# Patient Record
Sex: Female | Born: 1970 | Race: Black or African American | Hispanic: No | Smoking: Never smoker
Health system: Southern US, Community
[De-identification: ages and names within clinical notes are randomized; demographics above are authoritative.]

## PROBLEM LIST (undated history)

## (undated) ENCOUNTER — Emergency Department (HOSPITAL_COMMUNITY): Discharge status: Against Medical Advice | Payer: 59

## (undated) DIAGNOSIS — K56609 Unspecified intestinal obstruction, unspecified as to partial versus complete obstruction: Secondary | ICD-10-CM

## (undated) DIAGNOSIS — I10 Essential (primary) hypertension: Secondary | ICD-10-CM

## (undated) HISTORY — PX: ABDOMINAL EXPLORATION SURGERY: SHX538

---

## 2007-02-19 ENCOUNTER — Other Ambulatory Visit: Admission: RE | Admit: 2007-02-19 | Discharge: 2007-02-19 | Payer: Self-pay | Admitting: Obstetrics and Gynecology

## 2007-03-23 ENCOUNTER — Other Ambulatory Visit: Admission: RE | Admit: 2007-03-23 | Discharge: 2007-03-23 | Payer: Self-pay | Admitting: Obstetrics and Gynecology

## 2007-10-21 ENCOUNTER — Emergency Department (HOSPITAL_COMMUNITY): Admission: EM | Admit: 2007-10-21 | Discharge: 2007-10-21 | Payer: Self-pay | Admitting: Emergency Medicine

## 2007-12-04 ENCOUNTER — Inpatient Hospital Stay (HOSPITAL_COMMUNITY): Admission: EM | Admit: 2007-12-04 | Discharge: 2007-12-06 | Payer: Self-pay | Admitting: Emergency Medicine

## 2008-03-12 ENCOUNTER — Encounter: Admission: RE | Admit: 2008-03-12 | Discharge: 2008-04-08 | Payer: Self-pay | Admitting: Sports Medicine

## 2008-05-05 ENCOUNTER — Other Ambulatory Visit: Admission: RE | Admit: 2008-05-05 | Discharge: 2008-05-05 | Payer: Self-pay | Admitting: Obstetrics and Gynecology

## 2008-06-03 ENCOUNTER — Emergency Department (HOSPITAL_COMMUNITY): Admission: EM | Admit: 2008-06-03 | Discharge: 2008-06-03 | Payer: Self-pay | Admitting: Emergency Medicine

## 2008-06-24 ENCOUNTER — Encounter: Admission: RE | Admit: 2008-06-24 | Discharge: 2008-07-31 | Payer: Self-pay | Admitting: Family Medicine

## 2010-10-28 ENCOUNTER — Inpatient Hospital Stay (HOSPITAL_COMMUNITY): Admission: EM | Admit: 2010-10-28 | Discharge: 2009-11-25 | Payer: Self-pay | Admitting: Emergency Medicine

## 2011-02-06 LAB — COMPREHENSIVE METABOLIC PANEL
BUN: 8 mg/dL (ref 6–23)
CO2: 22 mEq/L (ref 19–32)
Calcium: 9.2 mg/dL (ref 8.4–10.5)
Chloride: 108 mEq/L (ref 96–112)
Creatinine, Ser: 0.91 mg/dL (ref 0.4–1.2)
GFR calc Af Amer: >60 mL/min (ref >60)
GFR calc non Af Amer: >60 mL/min (ref >60)
Glucose, Bld: 86 mg/dL (ref 70–99)
Total Bilirubin: 0.7 mg/dL (ref 0.3–1.2)

## 2011-02-06 LAB — CBC
HCT: 36.3 % (ref 36.0–46.0)
HCT: 40.7 % (ref 36.0–46.0)
Hemoglobin: 12.1 g/dL (ref 12.0–15.0)
Hemoglobin: 13.5 g/dL (ref 12.0–15.0)
MCHC: 33.1 g/dL (ref 30.0–36.0)
MCV: 90.8 fL (ref 78.0–100.0)
Platelets: 264 10*3/uL (ref 150–400)
RBC: 4.48 MIL/uL (ref 3.87–5.11)
RDW: 13 % (ref 11.5–15.5)
WBC: 9.2 10*3/uL (ref 4.0–10.5)
WBC: 9.7 10*3/uL (ref 4.0–10.5)

## 2011-02-06 LAB — URINALYSIS, ROUTINE W REFLEX MICROSCOPIC
Bilirubin Urine: NEGATIVE
Glucose, UA: NEGATIVE mg/dL
Hgb urine dipstick: NEGATIVE
Specific Gravity, Urine: 1.006 (ref 1.005–1.030)
Urobilinogen, UA: 0.2 mg/dL (ref 0.0–1.0)

## 2011-02-06 LAB — LIPASE, BLOOD: Lipase: 16 U/L (ref 11–59)

## 2011-02-06 LAB — DIFFERENTIAL
Basophils Absolute: 0 10*3/uL (ref 0.0–0.1)
Basophils Absolute: 0 10*3/uL (ref 0.0–0.1)
Eosinophils Relative: 0 % (ref 0–5)
Lymphocytes Relative: 13 % (ref 12–46)
Lymphocytes Relative: 16 % (ref 12–46)
Lymphs Abs: 1.3 10*3/uL (ref 0.7–4.0)
Lymphs Abs: 1.5 10*3/uL (ref 0.7–4.0)
Monocytes Absolute: 0.5 10*3/uL (ref 0.1–1.0)
Neutro Abs: 7.2 10*3/uL (ref 1.7–7.7)
Neutro Abs: 7.8 10*3/uL — ABNORMAL HIGH (ref 1.7–7.7)
Neutrophils Relative %: 81 % — ABNORMAL HIGH (ref 43–77)

## 2011-02-06 LAB — BASIC METABOLIC PANEL
Calcium: 8.4 mg/dL (ref 8.4–10.5)
GFR calc non Af Amer: >60 mL/min (ref >60)
Glucose, Bld: 102 mg/dL — ABNORMAL HIGH (ref 70–99)
Potassium: 3.7 mEq/L (ref 3.5–5.1)
Sodium: 139 mEq/L (ref 135–145)

## 2011-02-06 LAB — POCT PREGNANCY, URINE: Preg Test, Ur: NEGATIVE
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
## 2011-04-05 NOTE — Discharge Summary (Signed)
Marilyn Burgess, Marilyn Burgess              ACCOUNT NO.:  [id number]   MEDICAL RECORD NO.:  [id number]          PATIENT TYPE:  INP   LOCATION:  1532                         FACILITY:  [facility]   PHYSICIAN:  Lennie Muckle, MD      DATE OF BIRTH:  Dec 01, 1970   DATE OF ADMISSION:  12/04/2007  DATE OF DISCHARGE:  12/06/2007                               DISCHARGE SUMMARY   FINAL DIAGNOSIS:  Partial small bowel obstruction.   HOSPITAL COURSE:  [name] was admitted from the emergency department  on 12/03/06 due to abdominal pain, nausea and vomiting.  CT scan  revealed a partial bowel obstruction.  She was treated with NG tube for  decompression and began to have flatus as well as decrease in abdominal  pain.  On the 15th, her pain was resolved.  She was passing flatus  without nausea.  NG tube was able to be discontinued.  She was started  on a liquid diet, advanced to a regular diet without difficulty and had  no further episodes of pain.  She is discharged with instructions to  follow up with her primary care physician.  She will follow up with me  on p.r.n. basis as there is no need for surgical intervention.      Lennie Muckle, MD  Electronically Signed     ALA/MEDQ  D:  12/06/2007  T:  12/06/2007  Job:  [id number]   cc:   [address] Dr. Celene Skeen at [facility]

## 2011-05-17 ENCOUNTER — Emergency Department (HOSPITAL_COMMUNITY): Payer: 59

## 2011-05-17 ENCOUNTER — Emergency Department (HOSPITAL_COMMUNITY)
Admission: EM | Admit: 2011-05-17 | Discharge: 2011-05-17 | Disposition: A | Discharge status: Home / Self Care | Payer: 59 | Attending: Emergency Medicine | Admitting: Emergency Medicine

## 2011-05-17 DIAGNOSIS — I10 Essential (primary) hypertension: Secondary | ICD-10-CM | POA: Insufficient documentation

## 2011-05-17 DIAGNOSIS — R109 Unspecified abdominal pain: Secondary | ICD-10-CM | POA: Insufficient documentation

## 2011-05-17 DIAGNOSIS — E876 Hypokalemia: Secondary | ICD-10-CM | POA: Insufficient documentation

## 2011-05-17 LAB — URINE MICROSCOPIC-ADD ON

## 2011-05-17 LAB — URINALYSIS, ROUTINE W REFLEX MICROSCOPIC
Bilirubin Urine: NEGATIVE
Ketones, ur: NEGATIVE mg/dL
Nitrite: NEGATIVE
Protein, ur: NEGATIVE mg/dL
pH: 7 (ref 5.0–8.0)

## 2011-05-17 LAB — COMPREHENSIVE METABOLIC PANEL
AST: 18 U/L (ref 0–37)
BUN: 13 mg/dL (ref 6–23)
CO2: 25 mEq/L (ref 19–32)
Chloride: 100 mEq/L (ref 96–112)
Creatinine, Ser: 0.83 mg/dL (ref 0.50–1.10)
GFR calc Af Amer: >60 mL/min (ref >60)
GFR calc non Af Amer: >60 mL/min (ref >60)
Glucose, Bld: 110 mg/dL — ABNORMAL HIGH (ref 70–99)
Total Bilirubin: 0.4 mg/dL (ref 0.3–1.2)

## 2011-05-17 LAB — DIFFERENTIAL
Lymphocytes Relative: 26 % (ref 12–46)
Lymphs Abs: 2.1 10*3/uL (ref 0.7–4.0)
Monocytes Relative: 6 % (ref 3–12)
Neutrophils Relative %: 67 % (ref 43–77)

## 2011-05-17 LAB — LIPASE, BLOOD: Lipase: 19 U/L (ref 11–59)

## 2011-05-17 LAB — CBC
HCT: 38.8 % (ref 36.0–46.0)
MCH: 29.6 pg (ref 26.0–34.0)
MCV: 85.7 fL (ref 78.0–100.0)
RBC: 4.53 MIL/uL (ref 3.87–5.11)
WBC: 8.4 10*3/uL (ref 4.0–10.5)

## 2011-05-17 LAB — PREGNANCY, URINE: Preg Test, Ur: NEGATIVE

## 2011-08-11 LAB — URINALYSIS, ROUTINE W REFLEX MICROSCOPIC
Glucose, UA: NEGATIVE
Hgb urine dipstick: NEGATIVE
Protein, ur: 30 — AB
Specific Gravity, Urine: 1.025
pH: 8

## 2011-08-11 LAB — DIFFERENTIAL
Basophils Absolute: 0
Basophils Relative: 0
Lymphocytes Relative: 8 — ABNORMAL LOW
Monocytes Absolute: 0.2
Neutro Abs: 14.2 — ABNORMAL HIGH

## 2011-08-11 LAB — URINE MICROSCOPIC-ADD ON

## 2011-08-11 LAB — COMPREHENSIVE METABOLIC PANEL
AST: 22
Albumin: 3.7
Alkaline Phosphatase: 70
BUN: 7
Creatinine, Ser: 0.96
GFR calc Af Amer: >60
Potassium: 3.9
Total Protein: 7.3

## 2011-08-11 LAB — CBC
Hemoglobin: 14.2
MCHC: 34.5
Platelets: 349
RDW: 12.6

## 2011-08-18 LAB — URINALYSIS, ROUTINE W REFLEX MICROSCOPIC
Glucose, UA: NEGATIVE
Hgb urine dipstick: NEGATIVE
pH: 7

## 2011-08-18 LAB — POCT I-STAT, CHEM 8
BUN: 12
Chloride: 102
Creatinine, Ser: 1.1
Potassium: 3.6
Sodium: 139

## 2011-08-30 LAB — CBC
HCT: 35.8 — ABNORMAL LOW
Hemoglobin: 12.4
MCHC: 34.7
MCV: 87.3
RBC: 4.1

## 2011-08-30 LAB — BASIC METABOLIC PANEL
CO2: 25
Calcium: 8.5
Chloride: 108
GFR calc Af Amer: >60
Potassium: 3.6
Sodium: 139

## 2011-08-30 LAB — DIFFERENTIAL
Basophils Relative: 1
Eosinophils Relative: 0
Monocytes Absolute: 0.5
Monocytes Relative: 9
Neutro Abs: 3.2

## 2011-08-30 LAB — POCT CARDIAC MARKERS: Myoglobin, poc: 46

## 2011-10-06 DIAGNOSIS — E876 Hypokalemia: Secondary | ICD-10-CM

## 2011-10-06 HISTORY — DX: Hypokalemia: E87.6

## 2011-10-18 ENCOUNTER — Ambulatory Visit: Payer: 59 | Admitting: Physical Therapy

## 2011-10-19 ENCOUNTER — Ambulatory Visit: Payer: 59 | Attending: Family Medicine | Admitting: Physical Therapy

## 2011-10-19 DIAGNOSIS — M25619 Stiffness of unspecified shoulder, not elsewhere classified: Secondary | ICD-10-CM | POA: Insufficient documentation

## 2011-10-19 DIAGNOSIS — M25519 Pain in unspecified shoulder: Secondary | ICD-10-CM | POA: Insufficient documentation

## 2011-10-19 DIAGNOSIS — IMO0001 Reserved for inherently not codable concepts without codable children: Secondary | ICD-10-CM | POA: Insufficient documentation

## 2011-10-25 ENCOUNTER — Ambulatory Visit: Payer: 59 | Attending: Family Medicine | Admitting: Physical Therapy

## 2011-10-25 DIAGNOSIS — IMO0001 Reserved for inherently not codable concepts without codable children: Secondary | ICD-10-CM | POA: Insufficient documentation

## 2011-10-25 DIAGNOSIS — M25519 Pain in unspecified shoulder: Secondary | ICD-10-CM | POA: Insufficient documentation

## 2011-10-25 DIAGNOSIS — M25619 Stiffness of unspecified shoulder, not elsewhere classified: Secondary | ICD-10-CM | POA: Insufficient documentation

## 2011-10-28 ENCOUNTER — Ambulatory Visit: Payer: 59 | Admitting: Rehabilitation

## 2011-11-01 ENCOUNTER — Ambulatory Visit: Payer: 59 | Admitting: Physical Therapy

## 2011-11-03 ENCOUNTER — Ambulatory Visit: Payer: 59 | Admitting: Physical Therapy

## 2011-11-08 DIAGNOSIS — IMO0002 Reserved for concepts with insufficient information to code with codable children: Secondary | ICD-10-CM

## 2011-11-08 DIAGNOSIS — F064 Anxiety disorder due to known physiological condition: Secondary | ICD-10-CM

## 2011-11-08 DIAGNOSIS — I10 Essential (primary) hypertension: Secondary | ICD-10-CM

## 2011-11-08 HISTORY — DX: Essential (primary) hypertension: I10

## 2011-11-08 HISTORY — DX: Anxiety disorder due to known physiological condition: F06.4

## 2011-11-08 HISTORY — DX: Reserved for concepts with insufficient information to code with codable children: IMO0002

## 2011-11-09 ENCOUNTER — Ambulatory Visit: Payer: 59 | Admitting: Physical Therapy

## 2012-01-04 ENCOUNTER — Ambulatory Visit: Payer: 59 | Admitting: Physical Therapy

## 2012-01-17 ENCOUNTER — Ambulatory Visit: Payer: 59 | Attending: Family Medicine | Admitting: Physical Therapy

## 2012-01-17 DIAGNOSIS — M25519 Pain in unspecified shoulder: Secondary | ICD-10-CM | POA: Insufficient documentation

## 2012-01-17 DIAGNOSIS — M25619 Stiffness of unspecified shoulder, not elsewhere classified: Secondary | ICD-10-CM | POA: Insufficient documentation

## 2012-01-17 DIAGNOSIS — IMO0001 Reserved for inherently not codable concepts without codable children: Secondary | ICD-10-CM | POA: Insufficient documentation

## 2012-01-26 ENCOUNTER — Ambulatory Visit: Payer: 59 | Attending: Family Medicine | Admitting: Physical Therapy

## 2012-01-26 DIAGNOSIS — M25619 Stiffness of unspecified shoulder, not elsewhere classified: Secondary | ICD-10-CM | POA: Insufficient documentation

## 2012-01-26 DIAGNOSIS — IMO0001 Reserved for inherently not codable concepts without codable children: Secondary | ICD-10-CM | POA: Insufficient documentation

## 2012-01-26 DIAGNOSIS — M25519 Pain in unspecified shoulder: Secondary | ICD-10-CM | POA: Insufficient documentation

## 2012-01-31 ENCOUNTER — Ambulatory Visit: Payer: 59 | Admitting: Physical Therapy

## 2012-02-02 ENCOUNTER — Ambulatory Visit: Payer: 59 | Admitting: Physical Therapy

## 2012-02-07 ENCOUNTER — Ambulatory Visit: Payer: 59 | Admitting: Physical Therapy

## 2012-02-09 ENCOUNTER — Ambulatory Visit: Payer: 59 | Admitting: Physical Therapy

## 2012-02-14 ENCOUNTER — Ambulatory Visit: Payer: 59 | Admitting: Physical Therapy

## 2012-02-16 ENCOUNTER — Ambulatory Visit: Payer: 59 | Admitting: Physical Therapy

## 2012-02-21 ENCOUNTER — Ambulatory Visit: Payer: 59 | Attending: Family Medicine | Admitting: Physical Therapy

## 2012-02-21 DIAGNOSIS — M25619 Stiffness of unspecified shoulder, not elsewhere classified: Secondary | ICD-10-CM | POA: Insufficient documentation

## 2012-02-21 DIAGNOSIS — M25519 Pain in unspecified shoulder: Secondary | ICD-10-CM | POA: Insufficient documentation

## 2012-02-21 DIAGNOSIS — IMO0001 Reserved for inherently not codable concepts without codable children: Secondary | ICD-10-CM | POA: Insufficient documentation

## 2012-02-28 ENCOUNTER — Ambulatory Visit: Payer: 59 | Admitting: Physical Therapy

## 2012-03-01 ENCOUNTER — Ambulatory Visit: Payer: 59 | Admitting: Physical Therapy

## 2012-07-20 ENCOUNTER — Other Ambulatory Visit: Payer: Self-pay | Admitting: Obstetrics and Gynecology

## 2012-07-20 DIAGNOSIS — R928 Other abnormal and inconclusive findings on diagnostic imaging of breast: Secondary | ICD-10-CM

## 2012-07-25 ENCOUNTER — Ambulatory Visit
Admission: RE | Admit: 2012-07-25 | Discharge: 2012-07-25 | Disposition: A | Discharge status: Home / Self Care | Payer: 59 | Source: Ambulatory Visit | Attending: Obstetrics and Gynecology | Admitting: Obstetrics and Gynecology

## 2012-07-25 ENCOUNTER — Other Ambulatory Visit: Payer: Self-pay | Admitting: Obstetrics and Gynecology

## 2012-07-25 DIAGNOSIS — R928 Other abnormal and inconclusive findings on diagnostic imaging of breast: Secondary | ICD-10-CM

## 2012-07-30 ENCOUNTER — Ambulatory Visit
Admission: RE | Admit: 2012-07-30 | Discharge: 2012-07-30 | Disposition: A | Discharge status: Home / Self Care | Payer: 59 | Source: Ambulatory Visit | Attending: Obstetrics and Gynecology | Admitting: Obstetrics and Gynecology

## 2012-07-30 ENCOUNTER — Other Ambulatory Visit: Payer: Self-pay | Admitting: Obstetrics and Gynecology

## 2012-07-30 DIAGNOSIS — R928 Other abnormal and inconclusive findings on diagnostic imaging of breast: Secondary | ICD-10-CM

## 2012-07-31 ENCOUNTER — Ambulatory Visit
Admission: RE | Admit: 2012-07-31 | Discharge: 2012-07-31 | Disposition: A | Discharge status: Home / Self Care | Payer: 59 | Source: Ambulatory Visit | Attending: Obstetrics and Gynecology | Admitting: Obstetrics and Gynecology

## 2012-07-31 DIAGNOSIS — R928 Other abnormal and inconclusive findings on diagnostic imaging of breast: Secondary | ICD-10-CM

## 2013-02-14 ENCOUNTER — Other Ambulatory Visit: Payer: Self-pay | Admitting: Surgery

## 2013-07-29 ENCOUNTER — Other Ambulatory Visit: Payer: Self-pay

## 2013-07-29 DIAGNOSIS — Z1231 Encounter for screening mammogram for malignant neoplasm of breast: Secondary | ICD-10-CM

## 2013-11-04 ENCOUNTER — Encounter (HOSPITAL_COMMUNITY): Payer: Self-pay | Admitting: Emergency Medicine

## 2013-11-04 ENCOUNTER — Emergency Department (HOSPITAL_COMMUNITY)
Admission: EM | Admit: 2013-11-04 | Discharge: 2013-11-04 | Disposition: A | Discharge status: Home / Self Care | Payer: 59 | Attending: Emergency Medicine | Admitting: Emergency Medicine

## 2013-11-04 ENCOUNTER — Emergency Department (HOSPITAL_COMMUNITY): Payer: 59

## 2013-11-04 DIAGNOSIS — R109 Unspecified abdominal pain: Secondary | ICD-10-CM

## 2013-11-04 DIAGNOSIS — Z3202 Encounter for pregnancy test, result negative: Secondary | ICD-10-CM | POA: Insufficient documentation

## 2013-11-04 DIAGNOSIS — R1033 Periumbilical pain: Secondary | ICD-10-CM | POA: Insufficient documentation

## 2013-11-04 DIAGNOSIS — R630 Anorexia: Secondary | ICD-10-CM | POA: Insufficient documentation

## 2013-11-04 DIAGNOSIS — Z79899 Other long term (current) drug therapy: Secondary | ICD-10-CM | POA: Insufficient documentation

## 2013-11-04 DIAGNOSIS — K560 Paralytic ileus: Secondary | ICD-10-CM | POA: Insufficient documentation

## 2013-11-04 DIAGNOSIS — K567 Ileus, unspecified: Secondary | ICD-10-CM

## 2013-11-04 LAB — CBC WITH DIFFERENTIAL/PLATELET
Basophils Relative: 0 % (ref 0–1)
Eosinophils Relative: 0 % (ref 0–5)
HCT: 39.8 % (ref 36.0–46.0)
Lymphocytes Relative: 20 % (ref 12–46)
Lymphs Abs: 1.8 10*3/uL (ref 0.7–4.0)
MCV: 88.2 fL (ref 78.0–100.0)
Monocytes Absolute: 0.9 10*3/uL (ref 0.1–1.0)
Neutro Abs: 6.1 10*3/uL (ref 1.7–7.7)
Neutrophils Relative %: 69 % (ref 43–77)
RBC: 4.51 MIL/uL (ref 3.87–5.11)
RDW: 12.5 % (ref 11.5–15.5)
WBC: 8.8 10*3/uL (ref 4.0–10.5)

## 2013-11-04 LAB — URINALYSIS, ROUTINE W REFLEX MICROSCOPIC
Bilirubin Urine: NEGATIVE
Hgb urine dipstick: NEGATIVE
Ketones, ur: NEGATIVE mg/dL
Protein, ur: NEGATIVE mg/dL
Urobilinogen, UA: 0.2 mg/dL (ref 0.0–1.0)

## 2013-11-04 LAB — COMPREHENSIVE METABOLIC PANEL
ALT: 56 U/L — ABNORMAL HIGH (ref 0–35)
Albumin: 3.7 g/dL (ref 3.5–5.2)
Alkaline Phosphatase: 72 U/L (ref 39–117)
CO2: 23 mEq/L (ref 19–32)
Calcium: 8.9 mg/dL (ref 8.4–10.5)
Chloride: 101 mEq/L (ref 96–112)
GFR calc Af Amer: 76 mL/min — ABNORMAL LOW (ref >90)
GFR calc non Af Amer: 66 mL/min — ABNORMAL LOW (ref >90)
Glucose, Bld: 98 mg/dL (ref 70–99)
Potassium: 3.3 mEq/L — ABNORMAL LOW (ref 3.5–5.1)
Sodium: 136 mEq/L (ref 135–145)
Total Bilirubin: 0.6 mg/dL (ref 0.3–1.2)

## 2013-11-04 LAB — POCT PREGNANCY, URINE: Preg Test, Ur: NEGATIVE

## 2013-11-04 MED ORDER — IOHEXOL 300 MG/ML  SOLN
100.0000 mL | Freq: Once | INTRAMUSCULAR | Status: AC | PRN
  Administered 2013-11-04: 100 mL via INTRAVENOUS

## 2013-11-04 MED ORDER — HYDROCODONE-ACETAMINOPHEN 5-325 MG PO TABS: 1.0000 | ORAL_TABLET | Freq: Four times a day (QID) | ORAL | Status: DC | PRN

## 2013-11-04 MED ORDER — ONDANSETRON HCL 4 MG PO TABS: 4.0000 mg | ORAL_TABLET | Freq: Four times a day (QID) | ORAL | Status: DC

## 2013-11-04 MED ORDER — IOHEXOL 300 MG/ML  SOLN
50.0000 mL | Freq: Once | INTRAMUSCULAR | Status: AC | PRN
  Administered 2013-11-04: 50 mL via ORAL

## 2013-11-04 MED ORDER — ONDANSETRON HCL 4 MG/2ML IJ SOLN
4.0000 mg | Freq: Once | INTRAMUSCULAR | Status: AC
  Administered 2013-11-04: 4 mg via INTRAVENOUS
  Filled 2013-11-04: qty 2

## 2013-11-04 MED ORDER — MORPHINE SULFATE 4 MG/ML IJ SOLN
4.0000 mg | Freq: Once | INTRAMUSCULAR | Status: AC
  Administered 2013-11-04: 4 mg via INTRAVENOUS
  Filled 2013-11-04: qty 1

## 2013-11-04 MED ORDER — HYDROCODONE-ACETAMINOPHEN 5-325 MG PO TABS: 1.0000 | ORAL_TABLET | Freq: Once | ORAL | Status: DC
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
## 2013-11-04 NOTE — ED Notes (Signed)
Pt arrived to the ED via POV with a complaint of abdominal pain.  Pt states she has had a bowel obstruction in the past and the symptoms are similar.  Pt states she has had a small bowel movement about an hour ago. Pt states she hears noises coming from her abdomin area.  Pt states she took pepto-bismol earlier without relief of pain

## 2014-04-09 DIAGNOSIS — E559 Vitamin D deficiency, unspecified: Secondary | ICD-10-CM

## 2014-04-09 DIAGNOSIS — R42 Dizziness and giddiness: Secondary | ICD-10-CM | POA: Insufficient documentation

## 2014-04-09 DIAGNOSIS — N6019 Diffuse cystic mastopathy of unspecified breast: Secondary | ICD-10-CM

## 2014-04-09 DIAGNOSIS — I8390 Asymptomatic varicose veins of unspecified lower extremity: Secondary | ICD-10-CM

## 2014-04-09 HISTORY — DX: Vitamin D deficiency, unspecified: E55.9

## 2014-04-09 HISTORY — DX: Dizziness and giddiness: R42

## 2014-04-09 HISTORY — DX: Diffuse cystic mastopathy of unspecified breast: N60.19

## 2014-04-09 HISTORY — DX: Asymptomatic varicose veins of unspecified lower extremity: I83.90

## 2014-05-29 DIAGNOSIS — R42 Dizziness and giddiness: Secondary | ICD-10-CM | POA: Insufficient documentation

## 2014-05-29 DIAGNOSIS — N760 Acute vaginitis: Secondary | ICD-10-CM

## 2014-05-29 DIAGNOSIS — R61 Generalized hyperhidrosis: Secondary | ICD-10-CM | POA: Insufficient documentation

## 2014-05-29 DIAGNOSIS — N921 Excessive and frequent menstruation with irregular cycle: Secondary | ICD-10-CM

## 2014-05-29 DIAGNOSIS — N6011 Diffuse cystic mastopathy of right breast: Secondary | ICD-10-CM | POA: Insufficient documentation

## 2014-05-29 DIAGNOSIS — L500 Allergic urticaria: Secondary | ICD-10-CM | POA: Insufficient documentation

## 2014-05-29 DIAGNOSIS — L439 Lichen planus, unspecified: Secondary | ICD-10-CM

## 2014-05-29 DIAGNOSIS — R232 Flushing: Secondary | ICD-10-CM

## 2014-05-29 DIAGNOSIS — K141 Geographic tongue: Secondary | ICD-10-CM

## 2014-05-29 HISTORY — DX: Lichen planus, unspecified: L43.9

## 2014-05-29 HISTORY — DX: Acute vaginitis: N76.0

## 2014-05-29 HISTORY — DX: Allergic urticaria: L50.0

## 2014-05-29 HISTORY — DX: Diffuse cystic mastopathy of right breast: N60.11

## 2014-05-29 HISTORY — DX: Flushing: R23.2

## 2014-05-29 HISTORY — DX: Geographic tongue: K14.1

## 2014-05-29 HISTORY — DX: Excessive and frequent menstruation with irregular cycle: N92.1

## 2014-05-29 HISTORY — DX: Generalized hyperhidrosis: R61

## 2014-05-29 HISTORY — DX: Dizziness and giddiness: R42

## 2014-06-23 DIAGNOSIS — M25512 Pain in left shoulder: Secondary | ICD-10-CM

## 2014-06-23 HISTORY — DX: Pain in left shoulder: M25.512

## 2015-02-24 DIAGNOSIS — M150 Primary generalized (osteo)arthritis: Secondary | ICD-10-CM

## 2015-02-24 DIAGNOSIS — M159 Polyosteoarthritis, unspecified: Secondary | ICD-10-CM | POA: Insufficient documentation

## 2015-02-24 HISTORY — DX: Polyosteoarthritis, unspecified: M15.9

## 2015-02-24 HISTORY — DX: Primary generalized (osteo)arthritis: M15.0

## 2015-06-25 ENCOUNTER — Encounter (HOSPITAL_COMMUNITY): Payer: Self-pay | Admitting: Emergency Medicine

## 2015-06-25 ENCOUNTER — Emergency Department (HOSPITAL_COMMUNITY)
Admission: EM | Admit: 2015-06-25 | Discharge: 2015-06-25 | Disposition: A | Discharge status: Home / Self Care | Payer: 59 | Attending: Emergency Medicine | Admitting: Emergency Medicine

## 2015-06-25 ENCOUNTER — Emergency Department (HOSPITAL_COMMUNITY): Payer: 59

## 2015-06-25 DIAGNOSIS — R110 Nausea: Secondary | ICD-10-CM | POA: Insufficient documentation

## 2015-06-25 DIAGNOSIS — Z3202 Encounter for pregnancy test, result negative: Secondary | ICD-10-CM | POA: Insufficient documentation

## 2015-06-25 DIAGNOSIS — Z79899 Other long term (current) drug therapy: Secondary | ICD-10-CM | POA: Diagnosis not present

## 2015-06-25 DIAGNOSIS — R1033 Periumbilical pain: Secondary | ICD-10-CM | POA: Insufficient documentation

## 2015-06-25 DIAGNOSIS — Z8719 Personal history of other diseases of the digestive system: Secondary | ICD-10-CM | POA: Diagnosis not present

## 2015-06-25 HISTORY — DX: Unspecified intestinal obstruction, unspecified as to partial versus complete obstruction: K56.609

## 2015-06-25 LAB — COMPREHENSIVE METABOLIC PANEL
ALBUMIN: 3.9 g/dL (ref 3.5–5.0)
ALT: 10 U/L — AB (ref 14–54)
AST: 18 U/L (ref 15–41)
Alkaline Phosphatase: 64 U/L (ref 38–126)
Anion gap: 5 (ref 5–15)
BUN: 9 mg/dL (ref 6–20)
CO2: 26 mmol/L (ref 22–32)
CREATININE: 0.89 mg/dL (ref 0.44–1.00)
Calcium: 8.8 mg/dL — ABNORMAL LOW (ref 8.9–10.3)
Chloride: 106 mmol/L (ref 101–111)
GLUCOSE: 99 mg/dL (ref 65–99)
Potassium: 3.7 mmol/L (ref 3.5–5.1)
Sodium: 137 mmol/L (ref 135–145)
TOTAL PROTEIN: 6.9 g/dL (ref 6.5–8.1)
Total Bilirubin: 0.8 mg/dL (ref 0.3–1.2)

## 2015-06-25 LAB — URINALYSIS, ROUTINE W REFLEX MICROSCOPIC
BILIRUBIN URINE: NEGATIVE
Glucose, UA: NEGATIVE mg/dL
HGB URINE DIPSTICK: NEGATIVE
KETONES UR: NEGATIVE mg/dL
Leukocytes, UA: NEGATIVE
Nitrite: NEGATIVE
PH: 6.5 (ref 5.0–8.0)
PROTEIN: NEGATIVE mg/dL
Specific Gravity, Urine: 1.009 (ref 1.005–1.030)
UROBILINOGEN UA: 0.2 mg/dL (ref 0.0–1.0)

## 2015-06-25 LAB — CBC WITH DIFFERENTIAL/PLATELET
Basophils Absolute: 0 10*3/uL (ref 0.0–0.1)
Basophils Relative: 0 % (ref 0–1)
EOS ABS: 0.1 10*3/uL (ref 0.0–0.7)
Eosinophils Relative: 1 % (ref 0–5)
HEMATOCRIT: 40.5 % (ref 36.0–46.0)
Hemoglobin: 13.7 g/dL (ref 12.0–15.0)
Lymphocytes Relative: 22 % (ref 12–46)
Lymphs Abs: 2.1 10*3/uL (ref 0.7–4.0)
MCH: 30.6 pg (ref 26.0–34.0)
MCHC: 33.8 g/dL (ref 30.0–36.0)
MCV: 90.4 fL (ref 78.0–100.0)
Monocytes Absolute: 0.7 10*3/uL (ref 0.1–1.0)
Monocytes Relative: 7 % (ref 3–12)
NEUTROS PCT: 70 % (ref 43–77)
Neutro Abs: 6.9 10*3/uL (ref 1.7–7.7)
Platelets: 267 10*3/uL (ref 150–400)
RBC: 4.48 MIL/uL (ref 3.87–5.11)
RDW: 12.6 % (ref 11.5–15.5)
WBC: 9.7 10*3/uL (ref 4.0–10.5)

## 2015-06-25 LAB — LIPASE, BLOOD: LIPASE: 14 U/L — AB (ref 22–51)

## 2015-06-25 LAB — PREGNANCY, URINE: PREG TEST UR: NEGATIVE

## 2015-06-25 MED ORDER — METOCLOPRAMIDE HCL 10 MG PO TABS: 10.0000 mg | ORAL_TABLET | Freq: Four times a day (QID) | ORAL | Status: DC

## 2015-06-25 MED ORDER — SODIUM CHLORIDE 0.9 % IV BOLUS (SEPSIS)
500.0000 mL | Freq: Once | INTRAVENOUS | Status: AC
  Administered 2015-06-25: 500 mL via INTRAVENOUS

## 2015-06-25 MED ORDER — HYOSCYAMINE SULFATE 0.5 MG/ML IJ SOLN
0.1250 mg | Freq: Once | INTRAMUSCULAR | Status: DC
  Filled 2015-06-25: qty 0.25

## 2015-06-25 MED ORDER — ONDANSETRON HCL 4 MG/2ML IJ SOLN
4.0000 mg | Freq: Once | INTRAMUSCULAR | Status: AC
  Administered 2015-06-25: 4 mg via INTRAVENOUS
  Filled 2015-06-25: qty 2

## 2015-06-25 MED ORDER — MORPHINE SULFATE 4 MG/ML IJ SOLN
4.0000 mg | Freq: Once | INTRAMUSCULAR | Status: AC
  Administered 2015-06-25: 4 mg via INTRAVENOUS
  Filled 2015-06-25: qty 1

## 2015-06-25 NOTE — Discharge Instructions (Signed)

## 2015-06-25 NOTE — ED Provider Notes (Signed)
CSN: [id number]     Arrival date & time 06/25/15  9381 History   First MD Initiated Contact with Patient 06/25/15 0354     Chief Complaint  Patient presents with  . Abdominal Pain     (Consider location/radiation/quality/duration/timing/severity/associated sxs/prior Treatment) Patient is a 44 y.o. female presenting with abdominal pain. The history is provided by the patient. No language interpreter was used.  Abdominal Pain Pain location:  Periumbilical Pain quality: aching and sharp   Pain severity:  Moderate Onset quality:  Gradual Duration:  1 day Associated symptoms: nausea   Associated symptoms: no chest pain, no chills, no dysuria, no fever, no shortness of breath, no vaginal discharge and no vomiting   Associated symptoms comment:  Periumbilical abdominal pain starting last night similar to previous symptoms experienced with previous bowel obstruction. No fever. She has had nausea without vomiting. No diarrhea. No melena.    Past Medical History  Diagnosis Date  . Bowel obstruction    Past Surgical History  Procedure Laterality Date  . Abdominal exploration surgery     Family History  Problem Relation Age of Onset  . Hypertension Other    History  Substance Use Topics  . Smoking status: Never Smoker   . Smokeless tobacco: Not on file  . Alcohol Use: Yes   OB History    No data available     Review of Systems  Constitutional: Negative for fever and chills.  Respiratory: Negative.  Negative for shortness of breath.   Cardiovascular: Negative.  Negative for chest pain.  Gastrointestinal: Positive for nausea and abdominal pain. Negative for vomiting and blood in stool.  Genitourinary: Negative.  Negative for dysuria and vaginal discharge.  Musculoskeletal: Negative.  Negative for myalgias and back pain.  Skin: Negative.   Neurological: Negative.       Allergies  Review of patient's allergies indicates no known allergies.  Home Medications   Prior to  Admission medications   Medication Sig Start Date End Date Taking? Authorizing Provider  Fiber, Guar Gum, CHEW Chew 1 tablet by mouth as needed (indigestion).   Yes Historical Provider, MD  lisinopril (PRINIVIL,ZESTRIL) 10 MG tablet Take 20 mg by mouth every morning.    Yes Historical Provider, MD  metoprolol tartrate (LOPRESSOR) 25 MG tablet Take 12.5 mg by mouth every morning.   Yes Historical Provider, MD  norethindrone (MICRONOR,CAMILA,ERRIN) 0.35 MG tablet Take 1 tablet by mouth at bedtime.   Yes Historical Provider, MD  ALPRAZolam Duanne Moron) 0.5 MG tablet Take 0.5 mg by mouth 2 (two) times daily as needed for anxiety.    Historical Provider, MD  medroxyPROGESTERone (DEPO-PROVERA) 150 MG/ML injection Inject 150 mg into the muscle every 3 (three) months.    Historical Provider, MD   BP 157/92 mmHg  Pulse 67  Temp(Src) 98.2 F (36.8 C) (Oral)  Resp 16  Ht 4' 9.5" (1.461 m)  Wt 116 lb (52.617 kg)  BMI 24.65 kg/m2  SpO2 100% Physical Exam  Constitutional: She is oriented to person, place, and time. She appears well-developed and well-nourished.  HENT:  Head: Normocephalic.  Neck: Normal range of motion. Neck supple.  Cardiovascular: Normal rate and regular rhythm.   Pulmonary/Chest: Effort normal and breath sounds normal.  Abdominal: Soft. She exhibits no mass. There is tenderness. There is no rebound and no guarding.  Tenderness to soft abdomen in periumbilical area. BS hypoactive. Nondistended.  Musculoskeletal: Normal range of motion.  Neurological: She is alert and oriented to person, place, and time.  Skin: Skin is warm and dry. No rash noted.  Psychiatric: She has a normal mood and affect.    ED Course  Procedures (including critical care time) Labs Review Labs Reviewed  CBC WITH DIFFERENTIAL/PLATELET  COMPREHENSIVE METABOLIC PANEL  LIPASE, BLOOD  PREGNANCY, URINE  URINALYSIS, ROUTINE W REFLEX MICROSCOPIC (NOT AT [facility])   Results for orders placed or performed during the  hospital encounter of 06/25/15  Comprehensive metabolic panel  Result Value Ref Range   Sodium 137 135 - 145 mmol/L   Potassium 3.7 3.5 - 5.1 mmol/L   Chloride 106 101 - 111 mmol/L   CO2 26 22 - 32 mmol/L   Glucose, Bld 99 65 - 99 mg/dL   BUN 9 6 - 20 mg/dL   Creatinine, Ser 0.89 0.44 - 1.00 mg/dL   Calcium 8.8 (L) 8.9 - 10.3 mg/dL   Total Protein 6.9 6.5 - 8.1 g/dL   Albumin 3.9 3.5 - 5.0 g/dL   AST 18 15 - 41 U/L   ALT 10 (L) 14 - 54 U/L   Alkaline Phosphatase 64 38 - 126 U/L   Total Bilirubin 0.8 0.3 - 1.2 mg/dL   GFR calc non Af Amer >60 >60 mL/min   GFR calc Af Amer >60 >60 mL/min   Anion gap 5 5 - 15  CBC with Differential  Result Value Ref Range   WBC 9.7 4.0 - 10.5 K/uL   RBC 4.48 3.87 - 5.11 MIL/uL   Hemoglobin 13.7 12.0 - 15.0 g/dL   HCT 40.5 36.0 - 46.0 %   MCV 90.4 78.0 - 100.0 fL   MCH 30.6 26.0 - 34.0 pg   MCHC 33.8 30.0 - 36.0 g/dL   RDW 12.6 11.5 - 15.5 %   Platelets 267 150 - 400 K/uL   Neutrophils Relative % 70 43 - 77 %   Neutro Abs 6.9 1.7 - 7.7 K/uL   Lymphocytes Relative 22 12 - 46 %   Lymphs Abs 2.1 0.7 - 4.0 K/uL   Monocytes Relative 7 3 - 12 %   Monocytes Absolute 0.7 0.1 - 1.0 K/uL   Eosinophils Relative 1 0 - 5 %   Eosinophils Absolute 0.1 0.0 - 0.7 K/uL   Basophils Relative 0 0 - 1 %   Basophils Absolute 0.0 0.0 - 0.1 K/uL  Lipase, blood  Result Value Ref Range   Lipase 14 (L) 22 - 51 U/L  Pregnancy, urine  Result Value Ref Range   Preg Test, Ur NEGATIVE NEGATIVE  Urinalysis, Routine w reflex microscopic (not at [facility])  Result Value Ref Range   Color, Urine YELLOW YELLOW   APPearance CLEAR CLEAR   Specific Gravity, Urine 1.009 1.005 - 1.030   pH 6.5 5.0 - 8.0   Glucose, UA NEGATIVE NEGATIVE mg/dL   Hgb urine dipstick NEGATIVE NEGATIVE   Bilirubin Urine NEGATIVE NEGATIVE   Ketones, ur NEGATIVE NEGATIVE mg/dL   Protein, ur NEGATIVE NEGATIVE mg/dL   Urobilinogen, UA 0.2 0.0 - 1.0 mg/dL   Nitrite NEGATIVE NEGATIVE   Leukocytes, UA  NEGATIVE NEGATIVE   Dg Abd Acute W/chest  06/25/2015   CLINICAL DATA:  Mid abdominal pain and nausea.  Onset at 19:00.  EXAM: DG ABDOMEN ACUTE W/ 1V CHEST  COMPARISON:  CT 11/04/2013  FINDINGS: There is no evidence of dilated bowel loops or free intraperitoneal air. No radiopaque calculi or other significant radiographic abnormality is seen. Heart size and mediastinal contours are within normal limits. Both lungs are clear.  IMPRESSION:  Negative abdominal radiographs.  No acute cardiopulmonary disease.   Electronically Signed   By: Andreas Newport M.D.   On: 06/25/2015 05:58    Imaging Review No results found.   EKG Interpretation None      MDM   Final diagnoses:  None    1. Abdominal pain   No vomiting in ED. Pain controlled. Tolerating PO fluids. No evidence of recurrent bowel obstruction, labs and imaging reassuring. She is felt stable for discharge home.     Charlann Lange, PA-C 06/25/15 7416  Linton Flemings, MD 06/25/15 [phone]

## 2015-06-25 NOTE — ED Notes (Signed)
Pt is c/o generalized abd pain that started last night around 7pm  Pt states she has nausea and what she thinks is diarrhea

## 2015-12-06 ENCOUNTER — Emergency Department (HOSPITAL_COMMUNITY)
Admission: EM | Admit: 2015-12-06 | Discharge: 2015-12-07 | Disposition: A | Discharge status: Home / Self Care | Payer: 59 | Attending: Emergency Medicine | Admitting: Emergency Medicine

## 2015-12-06 ENCOUNTER — Encounter (HOSPITAL_COMMUNITY): Payer: Self-pay | Admitting: Family Medicine

## 2015-12-06 DIAGNOSIS — R1012 Left upper quadrant pain: Secondary | ICD-10-CM | POA: Insufficient documentation

## 2015-12-06 DIAGNOSIS — Z8719 Personal history of other diseases of the digestive system: Secondary | ICD-10-CM | POA: Insufficient documentation

## 2015-12-06 DIAGNOSIS — Z87828 Personal history of other (healed) physical injury and trauma: Secondary | ICD-10-CM | POA: Diagnosis not present

## 2015-12-06 DIAGNOSIS — R1011 Right upper quadrant pain: Secondary | ICD-10-CM | POA: Diagnosis not present

## 2015-12-06 DIAGNOSIS — R1033 Periumbilical pain: Secondary | ICD-10-CM

## 2015-12-06 DIAGNOSIS — Z79899 Other long term (current) drug therapy: Secondary | ICD-10-CM | POA: Diagnosis not present

## 2015-12-06 DIAGNOSIS — Z3202 Encounter for pregnancy test, result negative: Secondary | ICD-10-CM | POA: Insufficient documentation

## 2015-12-06 DIAGNOSIS — Z79818 Long term (current) use of other agents affecting estrogen receptors and estrogen levels: Secondary | ICD-10-CM | POA: Insufficient documentation

## 2015-12-06 DIAGNOSIS — R1013 Epigastric pain: Secondary | ICD-10-CM | POA: Diagnosis not present

## 2015-12-06 DIAGNOSIS — Z9889 Other specified postprocedural states: Secondary | ICD-10-CM | POA: Diagnosis not present

## 2015-12-06 DIAGNOSIS — R110 Nausea: Secondary | ICD-10-CM | POA: Diagnosis not present

## 2015-12-06 NOTE — ED Notes (Signed)
Pt is complaining of diffuse abd cramping with constipation and nausea that started today. Last bowel movement: about an hour ago x 2, once formed and once liquid.

## 2015-12-07 ENCOUNTER — Emergency Department (HOSPITAL_COMMUNITY): Payer: 59

## 2015-12-07 LAB — COMPREHENSIVE METABOLIC PANEL
ALK PHOS: 68 U/L (ref 38–126)
ALT: 12 U/L — ABNORMAL LOW (ref 14–54)
ANION GAP: 10 (ref 5–15)
AST: 18 U/L (ref 15–41)
Albumin: 4.2 g/dL (ref 3.5–5.0)
BILIRUBIN TOTAL: 1 mg/dL (ref 0.3–1.2)
BUN: 12 mg/dL (ref 6–20)
CALCIUM: 8.8 mg/dL — AB (ref 8.9–10.3)
CO2: 23 mmol/L (ref 22–32)
Chloride: 105 mmol/L (ref 101–111)
Creatinine, Ser: 0.87 mg/dL (ref 0.44–1.00)
Glucose, Bld: 100 mg/dL — ABNORMAL HIGH (ref 65–99)
POTASSIUM: 3.8 mmol/L (ref 3.5–5.1)
Sodium: 138 mmol/L (ref 135–145)
TOTAL PROTEIN: 7.1 g/dL (ref 6.5–8.1)

## 2015-12-07 LAB — URINE MICROSCOPIC-ADD ON

## 2015-12-07 LAB — URINALYSIS, ROUTINE W REFLEX MICROSCOPIC
BILIRUBIN URINE: NEGATIVE
Glucose, UA: NEGATIVE mg/dL
LEUKOCYTES UA: NEGATIVE
NITRITE: NEGATIVE
PH: 6.5 (ref 5.0–8.0)
Protein, ur: NEGATIVE mg/dL
SPECIFIC GRAVITY, URINE: 1.017 (ref 1.005–1.030)

## 2015-12-07 LAB — CBC
HEMATOCRIT: 40.9 % (ref 36.0–46.0)
HEMOGLOBIN: 13.7 g/dL (ref 12.0–15.0)
MCH: 30.3 pg (ref 26.0–34.0)
MCHC: 33.5 g/dL (ref 30.0–36.0)
MCV: 90.5 fL (ref 78.0–100.0)
Platelets: 304 10*3/uL (ref 150–400)
RBC: 4.52 MIL/uL (ref 3.87–5.11)
RDW: 12.9 % (ref 11.5–15.5)
WBC: 11.9 10*3/uL — AB (ref 4.0–10.5)

## 2015-12-07 LAB — LIPASE, BLOOD: Lipase: 19 U/L (ref 11–51)

## 2015-12-07 LAB — POC URINE PREG, ED: PREG TEST UR: NEGATIVE

## 2015-12-07 MED ORDER — ONDANSETRON HCL 4 MG/2ML IJ SOLN
4.0000 mg | Freq: Once | INTRAMUSCULAR | Status: AC
  Administered 2015-12-07: 4 mg via INTRAVENOUS
  Filled 2015-12-07: qty 2

## 2015-12-07 MED ORDER — SODIUM CHLORIDE 0.9 % IV BOLUS (SEPSIS)
1000.0000 mL | Freq: Once | INTRAVENOUS | Status: AC
  Administered 2015-12-07: 1000 mL via INTRAVENOUS

## 2015-12-07 MED ORDER — ONDANSETRON HCL 4 MG PO TABS: 4.0000 mg | ORAL_TABLET | Freq: Four times a day (QID) | ORAL | Status: DC

## 2015-12-07 MED ORDER — MORPHINE SULFATE (PF) 4 MG/ML IV SOLN
4.0000 mg | Freq: Once | INTRAVENOUS | Status: AC
  Administered 2015-12-07: 4 mg via INTRAVENOUS
  Filled 2015-12-07: qty 1

## 2015-12-07 MED ORDER — DICYCLOMINE HCL 20 MG PO TABS: 20.0000 mg | ORAL_TABLET | Freq: Two times a day (BID) | ORAL | Status: DC

## 2015-12-07 NOTE — Discharge Instructions (Signed)

## 2015-12-07 NOTE — ED Notes (Signed)
Fluids given.  Pain assessed to 3, no reports nausea, pt ready for discharge soon
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
## 2016-03-10 DIAGNOSIS — N6310 Unspecified lump in the right breast, unspecified quadrant: Secondary | ICD-10-CM

## 2016-03-10 HISTORY — DX: Unspecified lump in the right breast, unspecified quadrant: N63.10

## 2016-05-30 ENCOUNTER — Encounter (HOSPITAL_COMMUNITY): Payer: Self-pay | Admitting: Emergency Medicine

## 2016-05-30 ENCOUNTER — Observation Stay (HOSPITAL_COMMUNITY)
Admission: EM | Admit: 2016-05-30 | Discharge: 2016-05-31 | Disposition: A | Discharge status: Home / Self Care | Payer: 59 | Attending: General Surgery | Admitting: General Surgery

## 2016-05-30 DIAGNOSIS — R109 Unspecified abdominal pain: Secondary | ICD-10-CM

## 2016-05-30 DIAGNOSIS — Z79899 Other long term (current) drug therapy: Secondary | ICD-10-CM | POA: Insufficient documentation

## 2016-05-30 DIAGNOSIS — K5660 Unspecified intestinal obstruction: Secondary | ICD-10-CM | POA: Diagnosis not present

## 2016-05-30 DIAGNOSIS — I10 Essential (primary) hypertension: Secondary | ICD-10-CM | POA: Insufficient documentation

## 2016-05-30 DIAGNOSIS — R112 Nausea with vomiting, unspecified: Secondary | ICD-10-CM

## 2016-05-30 DIAGNOSIS — R1084 Generalized abdominal pain: Secondary | ICD-10-CM | POA: Diagnosis present

## 2016-05-30 DIAGNOSIS — K56609 Unspecified intestinal obstruction, unspecified as to partial versus complete obstruction: Secondary | ICD-10-CM | POA: Diagnosis present

## 2016-05-30 LAB — COMPREHENSIVE METABOLIC PANEL
ALT: 23 U/L (ref 14–54)
ANION GAP: 9 (ref 5–15)
AST: 26 U/L (ref 15–41)
Albumin: 4.3 g/dL (ref 3.5–5.0)
Alkaline Phosphatase: 65 U/L (ref 38–126)
BUN: 12 mg/dL (ref 6–20)
CHLORIDE: 103 mmol/L (ref 101–111)
CO2: 25 mmol/L (ref 22–32)
Calcium: 9.4 mg/dL (ref 8.9–10.3)
Creatinine, Ser: 0.88 mg/dL (ref 0.44–1.00)
GFR calc non Af Amer: >60 mL/min (ref >60)
Glucose, Bld: 107 mg/dL — ABNORMAL HIGH (ref 65–99)
POTASSIUM: 3.9 mmol/L (ref 3.5–5.1)
SODIUM: 137 mmol/L (ref 135–145)
Total Bilirubin: 0.7 mg/dL (ref 0.3–1.2)
Total Protein: 7.5 g/dL (ref 6.5–8.1)

## 2016-05-30 LAB — CBC
HCT: 41.5 % (ref 36.0–46.0)
HEMOGLOBIN: 13.9 g/dL (ref 12.0–15.0)
MCH: 29.9 pg (ref 26.0–34.0)
MCHC: 33.5 g/dL (ref 30.0–36.0)
MCV: 89.2 fL (ref 78.0–100.0)
Platelets: 285 10*3/uL (ref 150–400)
RBC: 4.65 MIL/uL (ref 3.87–5.11)
RDW: 12.9 % (ref 11.5–15.5)
WBC: 9.6 10*3/uL (ref 4.0–10.5)

## 2016-05-30 LAB — I-STAT BETA HCG BLOOD, ED (MC, WL, AP ONLY): I-stat hCG, quantitative: <5 m[IU]/mL (ref <5)

## 2016-05-30 LAB — LIPASE, BLOOD: LIPASE: 17 U/L (ref 11–51)

## 2016-05-30 MED ORDER — FENTANYL CITRATE (PF) 100 MCG/2ML IJ SOLN
50.0000 ug | INTRAMUSCULAR | Status: AC | PRN
Start: 2016-05-30 — End: 2016-05-30
  Administered 2016-05-30 (×2): 50 ug via NASAL
  Filled 2016-05-30 (×2): qty 2

## 2016-05-30 MED ORDER — MORPHINE SULFATE (PF) 4 MG/ML IV SOLN
4.0000 mg | Freq: Once | INTRAVENOUS | Status: AC
  Administered 2016-05-30: 4 mg via INTRAVENOUS
  Filled 2016-05-30: qty 1

## 2016-05-30 MED ORDER — ONDANSETRON 4 MG PO TBDP
4.0000 mg | ORAL_TABLET | Freq: Once | ORAL | Status: AC | PRN
  Administered 2016-05-30: 4 mg via ORAL
  Filled 2016-05-30: qty 1

## 2016-05-30 MED ORDER — ONDANSETRON 4 MG PO TBDP
4.0000 mg | ORAL_TABLET | Freq: Once | ORAL | Status: AC
  Administered 2016-05-30: 4 mg via ORAL
  Filled 2016-05-30: qty 1

## 2016-05-30 NOTE — ED Notes (Signed)
Patient presents for generalized abdominal pain starting 1500 today, nausea and 2-3 soft formed BM. Denies emesis. History of SBO.
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
## 2016-05-31 ENCOUNTER — Encounter (HOSPITAL_COMMUNITY): Payer: Self-pay | Admitting: Radiology

## 2016-05-31 ENCOUNTER — Emergency Department (HOSPITAL_COMMUNITY): Payer: 59

## 2016-05-31 DIAGNOSIS — K56609 Unspecified intestinal obstruction, unspecified as to partial versus complete obstruction: Secondary | ICD-10-CM

## 2016-05-31 HISTORY — DX: Unspecified intestinal obstruction, unspecified as to partial versus complete obstruction: K56.609

## 2016-05-31 LAB — URINALYSIS, ROUTINE W REFLEX MICROSCOPIC
Bilirubin Urine: NEGATIVE
Glucose, UA: NEGATIVE mg/dL
HGB URINE DIPSTICK: NEGATIVE
Ketones, ur: 40 mg/dL — AB
Leukocytes, UA: NEGATIVE
NITRITE: NEGATIVE
Protein, ur: 30 mg/dL — AB
SPECIFIC GRAVITY, URINE: 1.023 (ref 1.005–1.030)
pH: 8.5 — ABNORMAL HIGH (ref 5.0–8.0)

## 2016-05-31 LAB — URINE MICROSCOPIC-ADD ON
BACTERIA UA: NONE SEEN
RBC / HPF: NONE SEEN RBC/hpf (ref 0–5)
WBC, UA: NONE SEEN WBC/hpf (ref 0–5)

## 2016-05-31 MED ORDER — IOPAMIDOL (ISOVUE-300) INJECTION 61%
100.0000 mL | Freq: Once | INTRAVENOUS | Status: AC | PRN
  Administered 2016-05-31: 100 mL via INTRAVENOUS

## 2016-05-31 MED ORDER — METOPROLOL TARTRATE 5 MG/5ML IV SOLN
5.0000 mg | Freq: Four times a day (QID) | INTRAVENOUS | Status: DC
  Administered 2016-05-31: 5 mg via INTRAVENOUS
  Filled 2016-05-31: qty 5

## 2016-05-31 MED ORDER — ONDANSETRON 4 MG PO TBDP: 4.0000 mg | ORAL_TABLET | Freq: Four times a day (QID) | ORAL | Status: DC | PRN

## 2016-05-31 MED ORDER — ENOXAPARIN SODIUM 40 MG/0.4ML ~~LOC~~ SOLN
40.0000 mg | SUBCUTANEOUS | Status: DC
  Administered 2016-05-31: 40 mg via SUBCUTANEOUS
  Filled 2016-05-31: qty 0.4

## 2016-05-31 MED ORDER — MORPHINE SULFATE (PF) 2 MG/ML IV SOLN: 2.0000 mg | INTRAVENOUS | Status: DC | PRN

## 2016-05-31 MED ORDER — FAMOTIDINE IN NACL 20-0.9 MG/50ML-% IV SOLN
20.0000 mg | Freq: Two times a day (BID) | INTRAVENOUS | Status: DC
  Administered 2016-05-31: 20 mg via INTRAVENOUS
  Filled 2016-05-31 (×2): qty 50

## 2016-05-31 MED ORDER — KCL IN DEXTROSE-NACL 20-5-0.9 MEQ/L-%-% IV SOLN
INTRAVENOUS | Status: DC
  Administered 2016-05-31: 04:00:00 via INTRAVENOUS
  Filled 2016-05-31 (×2): qty 1000

## 2016-05-31 MED ORDER — HYDROCODONE-ACETAMINOPHEN 10-325 MG PO TABS: 1.0000 | ORAL_TABLET | ORAL | Status: DC | PRN

## 2016-05-31 MED ORDER — ONDANSETRON HCL 4 MG/2ML IJ SOLN: 4.0000 mg | Freq: Four times a day (QID) | INTRAMUSCULAR | Status: DC | PRN

## 2016-05-31 NOTE — ED Notes (Signed)
Report given to RN on 3rd floor.
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
## 2016-05-31 NOTE — Progress Notes (Signed)
Discharge instructions discussed with patient, verbalized agreement and understanding, 
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
## 2017-06-14 DIAGNOSIS — D171 Benign lipomatous neoplasm of skin and subcutaneous tissue of trunk: Secondary | ICD-10-CM | POA: Insufficient documentation

## 2017-06-14 HISTORY — DX: Benign lipomatous neoplasm of skin and subcutaneous tissue of trunk: D17.1

## 2017-12-12 DIAGNOSIS — F411 Generalized anxiety disorder: Secondary | ICD-10-CM

## 2017-12-12 DIAGNOSIS — F410 Panic disorder [episodic paroxysmal anxiety] without agoraphobia: Secondary | ICD-10-CM

## 2017-12-12 DIAGNOSIS — G5601 Carpal tunnel syndrome, right upper limb: Secondary | ICD-10-CM | POA: Insufficient documentation

## 2017-12-12 HISTORY — DX: Panic disorder (episodic paroxysmal anxiety): F41.1

## 2017-12-12 HISTORY — DX: Carpal tunnel syndrome, right upper limb: G56.01

## 2017-12-12 HISTORY — DX: Panic disorder (episodic paroxysmal anxiety): F41.0

## 2018-11-30 DIAGNOSIS — Z3041 Encounter for surveillance of contraceptive pills: Secondary | ICD-10-CM

## 2018-11-30 HISTORY — DX: Encounter for surveillance of contraceptive pills: Z30.41

## 2019-01-08 DIAGNOSIS — H9193 Unspecified hearing loss, bilateral: Secondary | ICD-10-CM

## 2019-01-08 HISTORY — DX: Unspecified hearing loss, bilateral: H91.93

## 2019-01-15 DIAGNOSIS — F4541 Pain disorder exclusively related to psychological factors: Secondary | ICD-10-CM | POA: Insufficient documentation

## 2019-01-15 HISTORY — DX: Pain disorder exclusively related to psychological factors: F45.41

## 2019-12-03 DIAGNOSIS — R87810 Cervical high risk human papillomavirus (HPV) DNA test positive: Secondary | ICD-10-CM | POA: Insufficient documentation

## 2019-12-03 HISTORY — DX: Cervical high risk human papillomavirus (HPV) DNA test positive: R87.810

## 2020-04-21 DIAGNOSIS — F418 Other specified anxiety disorders: Secondary | ICD-10-CM | POA: Insufficient documentation

## 2020-04-21 HISTORY — DX: Other specified anxiety disorders: F41.8

## 2021-01-05 DIAGNOSIS — M67432 Ganglion, left wrist: Secondary | ICD-10-CM

## 2021-01-05 HISTORY — DX: Ganglion, left wrist: M67.432

## 2021-04-27 DIAGNOSIS — B009 Herpesviral infection, unspecified: Secondary | ICD-10-CM

## 2021-04-27 HISTORY — DX: Herpesviral infection, unspecified: B00.9

## 2021-12-20 DIAGNOSIS — F5102 Adjustment insomnia: Secondary | ICD-10-CM | POA: Insufficient documentation

## 2021-12-20 HISTORY — DX: Adjustment insomnia: F51.02

## 2022-01-23 ENCOUNTER — Emergency Department (HOSPITAL_BASED_OUTPATIENT_CLINIC_OR_DEPARTMENT_OTHER)
Admission: EM | Admit: 2022-01-23 | Discharge: 2022-01-23 | Disposition: A | Discharge status: Home / Self Care | Payer: Managed Care, Other (non HMO) | Attending: Emergency Medicine | Admitting: Emergency Medicine

## 2022-01-23 ENCOUNTER — Other Ambulatory Visit: Payer: Self-pay

## 2022-01-23 ENCOUNTER — Encounter (HOSPITAL_BASED_OUTPATIENT_CLINIC_OR_DEPARTMENT_OTHER): Payer: Self-pay

## 2022-01-23 DIAGNOSIS — E876 Hypokalemia: Secondary | ICD-10-CM | POA: Insufficient documentation

## 2022-01-23 DIAGNOSIS — R197 Diarrhea, unspecified: Secondary | ICD-10-CM | POA: Diagnosis not present

## 2022-01-23 DIAGNOSIS — R109 Unspecified abdominal pain: Secondary | ICD-10-CM | POA: Insufficient documentation

## 2022-01-23 HISTORY — DX: Essential (primary) hypertension: I10

## 2022-01-23 LAB — COMPREHENSIVE METABOLIC PANEL
ALT: 21 U/L (ref 0–44)
AST: 21 U/L (ref 15–41)
Albumin: 3.7 g/dL (ref 3.5–5.0)
Alkaline Phosphatase: 67 U/L (ref 38–126)
Anion gap: 9 (ref 5–15)
BUN: 6 mg/dL (ref 6–20)
CO2: 24 mmol/L (ref 22–32)
Calcium: 8.8 mg/dL — ABNORMAL LOW (ref 8.9–10.3)
Chloride: 102 mmol/L (ref 98–111)
Creatinine, Ser: 0.99 mg/dL (ref 0.44–1.00)
GFR, Estimated: >60 mL/min (ref >60)
Glucose, Bld: 115 mg/dL — ABNORMAL HIGH (ref 70–99)
Potassium: 2.7 mmol/L — CL (ref 3.5–5.1)
Sodium: 135 mmol/L (ref 135–145)
Total Bilirubin: 0.7 mg/dL (ref 0.3–1.2)
Total Protein: 7.2 g/dL (ref 6.5–8.1)

## 2022-01-23 LAB — CBC
HCT: 39.2 % (ref 36.0–46.0)
Hemoglobin: 13.8 g/dL (ref 12.0–15.0)
MCH: 31.6 pg (ref 26.0–34.0)
MCHC: 35.2 g/dL (ref 30.0–36.0)
MCV: 89.7 fL (ref 80.0–100.0)
Platelets: 344 10*3/uL (ref 150–400)
RBC: 4.37 MIL/uL (ref 3.87–5.11)
RDW: 11.9 % (ref 11.5–15.5)
WBC: 6.7 10*3/uL (ref 4.0–10.5)
nRBC: 0 % (ref 0.0–0.2)

## 2022-01-23 LAB — URINALYSIS, MICROSCOPIC (REFLEX)

## 2022-01-23 LAB — URINALYSIS, ROUTINE W REFLEX MICROSCOPIC
Bilirubin Urine: NEGATIVE
Glucose, UA: NEGATIVE mg/dL
Ketones, ur: NEGATIVE mg/dL
Leukocytes,Ua: NEGATIVE
Nitrite: NEGATIVE
Protein, ur: NEGATIVE mg/dL
Specific Gravity, Urine: <=1.005 (ref 1.005–1.030)
pH: 6 (ref 5.0–8.0)

## 2022-01-23 LAB — PREGNANCY, URINE: Preg Test, Ur: NEGATIVE

## 2022-01-23 LAB — LIPASE, BLOOD: Lipase: 34 U/L (ref 11–51)

## 2022-01-23 MED ORDER — POTASSIUM CHLORIDE CRYS ER 20 MEQ PO TBCR
40.0000 meq | EXTENDED_RELEASE_TABLET | Freq: Once | ORAL | Status: AC
  Administered 2022-01-23: 40 meq via ORAL
  Filled 2022-01-23: qty 2

## 2022-01-23 MED ORDER — SODIUM CHLORIDE 0.9 % IV BOLUS
1000.0000 mL | Freq: Once | INTRAVENOUS | Status: AC
  Administered 2022-01-23: 1000 mL via INTRAVENOUS

## 2022-01-23 MED ORDER — LOPERAMIDE HCL 2 MG PO CAPS
4.0000 mg | ORAL_CAPSULE | Freq: Once | ORAL | Status: AC
  Administered 2022-01-23: 4 mg via ORAL
  Filled 2022-01-23: qty 2

## 2022-01-23 MED ORDER — POTASSIUM CHLORIDE CRYS ER 20 MEQ PO TBCR: 20.0000 meq | EXTENDED_RELEASE_TABLET | Freq: Every day | ORAL | 0 refills | Status: DC

## 2022-01-23 MED ORDER — POTASSIUM CHLORIDE 10 MEQ/100ML IV SOLN
10.0000 meq | Freq: Once | INTRAVENOUS | Status: AC
  Administered 2022-01-23: 10 meq via INTRAVENOUS
  Filled 2022-01-23: qty 100

## 2022-01-23 MED ORDER — ONDANSETRON 4 MG PO TBDP
4.0000 mg | ORAL_TABLET | Freq: Once | ORAL | Status: AC | PRN
  Administered 2022-01-23: 4 mg via ORAL
  Filled 2022-01-23: qty 1

## 2022-01-23 NOTE — ED Notes (Signed)
EDP and primary RN Crystal made aware of Potassium 2.7. Pt placed on cardiac monitoring, EKG obtained by V/O. ?

## 2022-01-23 NOTE — ED Provider Notes (Signed)
?[facility] DEPART[facility] ?Provider Note ? ? ?CSN: [id number] ?Arrival date & time: 01/23/22  0247 ? ?  ? ?History ? ?Chief Complaint  ?Patient presents with  ? Diarrhea  ? ? ?Marilyn Burgess is a 51 y.o. female. ? ?Patient presents ER chief complaint of diarrhea ongoing for the past week.  She has tried a brat type diet with some improvement.  And then when she would advance her diet and had a regular sandwich she had diarrhea again.  This newest episode of diarrhea has been going on for the past 4 days.  Associated with abdominal cramping.  Denies any blood in her stool.  Denies any vomiting denies fevers.  Denies any recent travel or recent antibiotic use. ? ? ?  ? ?Home Medications ?Prior to Admission medications   ?Medication Sig Start Date End Date Taking? Authorizing Provider  ?potassium chloride SA (KLOR-CON M) 20 MEQ tablet Take 1 tablet (20 mEq total) by mouth daily for 3 days. 01/23/22 01/26/22 Yes Luna Fuse, MD  ?ALPRAZolam Duanne Moron) 0.5 MG tablet Take 0.5 mg by mouth 2 (two) times daily as needed for anxiety.    [provider]  ?dicyclomine (BENTYL) 20 MG tablet Take 1 tablet (20 mg total) by mouth 2 (two) times daily. ?Patient not taking: Reported on 05/30/2016 12/07/15   Charlann Lange, PA-C  ?Fiber, Guar Gum, CHEW Chew 1 tablet by mouth as needed (indigestion).    [provider]  ?lisinopril (PRINIVIL,ZESTRIL) 30 MG tablet Take 30 mg by mouth daily.    [provider]  ?metoCLOPramide (REGLAN) 10 MG tablet Take 1 tablet (10 mg total) by mouth every 6 (six) hours. ?Patient not taking: Reported on 12/07/2015 06/25/15   Charlann Lange, PA-C  ?metoprolol (LOPRESSOR) 50 MG tablet Take 25 mg by mouth daily. 05/27/16   [provider]  ?norethindrone (MICRONOR,CAMILA,ERRIN) 0.35 MG tablet Take 1 tablet by mouth at bedtime.    [provider]  ?ondansetron (ZOFRAN) 4 MG tablet Take 1 tablet (4 mg total) by mouth every 6 (six) hours. ?Patient not taking:  Reported on 05/30/2016 12/07/15   Charlann Lange, PA-C  ?   ? ?Allergies    ?Patient has no known allergies.   ? ?Review of Systems   ?Review of Systems  ?Constitutional:  Negative for fever.  ?HENT:  Negative for ear pain.   ?Eyes:  Negative for pain.  ?Respiratory:  Negative for cough.   ?Cardiovascular:  Negative for chest pain.  ?Gastrointestinal:  Positive for abdominal pain and diarrhea.  ?Genitourinary:  Negative for flank pain.  ?Musculoskeletal:  Negative for back pain.  ?Skin:  Negative for rash.  ?Neurological:  Negative for headaches.  ? ?Physical Exam ?Updated Vital Signs ?BP 108/72   Pulse (!) 54   Temp 98 ?F (36.7 ?C) (Oral)   Resp 13   Ht '4\' 9"'$  (1.448 m)   Wt 52.6 kg   SpO2 100%   BMI 25.10 kg/m?  ?Physical Exam ?Constitutional:   ?   General: She is not in acute distress. ?   Appearance: Normal appearance.  ?HENT:  ?   Head: Normocephalic.  ?   Nose: Nose normal.  ?Eyes:  ?   Extraocular Movements: Extraocular movements intact.  ?Cardiovascular:  ?   Rate and Rhythm: Normal rate.  ?Pulmonary:  ?   Effort: Pulmonary effort is normal.  ?Abdominal:  ?   Tenderness: There is no abdominal tenderness. There is no guarding or rebound.  ?Musculoskeletal:     ?  General: Normal range of motion.  ?   Cervical back: Normal range of motion.  ?Neurological:  ?   General: No focal deficit present.  ?   Mental Status: She is alert. Mental status is at baseline.  ? ? ?ED Results / Procedures / Treatments   ?Labs ?(all labs ordered are listed, but only abnormal results are displayed) ?Labs Reviewed  ?COMPREHENSIVE METABOLIC PANEL - Abnormal; Notable for the following components:  ?    Result Value  ? Potassium 2.7 (*)   ? Glucose, Bld 115 (*)   ? Calcium 8.8 (*)   ? All other components within normal limits  ?URINALYSIS, ROUTINE W REFLEX MICROSCOPIC - Abnormal; Notable for the following components:  ? Hgb urine dipstick TRACE (*)   ? All other components within normal limits  ?URINALYSIS, MICROSCOPIC (REFLEX)  - Abnormal; Notable for the following components:  ? Bacteria, UA FEW (*)   ? All other components within normal limits  ?LIPASE, BLOOD  ?CBC  ?PREGNANCY, URINE  ? ? ?EKG ?EKG Interpretation ? ?Date/Time:  Sunday January 23 2022 04:03:11 EST ?Ventricular Rate:  56 ?PR Interval:  191 ?QRS Duration: 86 ?QT Interval:  402 ?QTC Calculation: 388 ?R Axis:   79 ?Text Interpretation: Sinus rhythm Borderline T abnormalities, anterior leads Confirmed by Thamas Jaegers ([id number]) on 01/23/2022 4:26:32 AM ? ?Radiology ?No results found. ? ?Procedures ?Marland KitchenCritical Care ?Performed by: Luna Fuse, MD ?Authorized by: Luna Fuse, MD  ? ?Critical care provider statement:  ?  Critical care time (minutes):  30 ?  Critical care time was exclusive of:  Separately billable procedures and treating other patients and teaching time ?  Critical care was necessary to treat or prevent imminent or life-threatening deterioration of the following conditions:  Metabolic crisis  ? ? ?Medications Ordered in ED ?Medications  ?potassium chloride 10 mEq in 100 mL IVPB (10 mEq Intravenous New Bag/Given 01/23/22 0405)  ?ondansetron (ZOFRAN-ODT) disintegrating tablet 4 mg (4 mg Oral Given 01/23/22 0305)  ?loperamide (IMODIUM) capsule 4 mg (4 mg Oral Given 01/23/22 0339)  ?sodium chloride 0.9 % bolus 1,000 mL (1,000 mLs Intravenous New Bag/Given 01/23/22 0400)  ?potassium chloride SA (KLOR-CON M) CR tablet 40 mEq (40 mEq Oral Given 01/23/22 0405)  ? ? ?ED Course/ Medical Decision Making/ A&P ?  ?                        ?Medical Decision Making ?Amount and/or Complexity of Data Reviewed ?External Data Reviewed: notes. ?   Details: Chart review shows general surgery evaluations in July 2022. ?Labs: ordered. ?   Details: Labs are sent white count normal.  Chemistry concerning for potassium of 2.7. ?Radiology:  ?   Details: Decision not to pursue CT scan of the abdomen pelvis.  Patient has no tenderness no guarding no rebound.  No fevers or white count. ? ?Risk ?Prescription  drug management. ?Risk Details: Patient presents with persistent diarrhea for several days now nonbloody.  Able to tolerate oral intake however.  Potassium is low at 2.7 given repletion IV potassium and oral potassium. ? ?Recommend outpatient follow-up with her doctor this week for stool samples and further evaluation.  Recommending immediate return if she cannot keep down any fluids, has worsening symptoms fevers pain or any additional concerns to return immediately back to the ER. ? ? ? ? ? ? ? ? ? ? ?Final Clinical Impression(s) / ED Diagnoses ?Final diagnoses:  ?Diarrhea, unspecified type  ? ? ?  Rx / DC Orders ?ED Discharge Orders   ? ?      Ordered  ?  potassium chloride SA (KLOR-CON M) 20 MEQ tablet  Daily       ? 01/23/22 0442  ? ?  ?  ? ?  ? ? ?  ?Luna Fuse, MD ?01/23/22 [phone] ? ?

## 2022-01-23 NOTE — Discharge Instructions (Signed)
Take Imodium as needed for your diarrhea. ? ?Follow-up with your doctor this week and follow-up stool cultures. ? ?Return back to the ER if you cannot keep down any fluids, if you have worsening symptoms, or if you have any additional concerns. ?

## 2022-01-23 NOTE — ED Triage Notes (Signed)
Pt states she has not been feeling well for past several days, started having diarrhea  and abdominal pain 4 days ago, has been seen for same. Pt states she tried the [employer] and seemed to be doing better yesterday and then ate a chicken sandwich around 1200 noon and then diarrhea started back. Pt has had nausea, denies vomiting.  ?

## 2022-03-04 ENCOUNTER — Emergency Department (HOSPITAL_BASED_OUTPATIENT_CLINIC_OR_DEPARTMENT_OTHER)
Admission: EM | Admit: 2022-03-04 | Discharge: 2022-03-05 | Disposition: A | Discharge status: Home / Self Care | Payer: Managed Care, Other (non HMO) | Attending: Emergency Medicine | Admitting: Emergency Medicine

## 2022-03-04 ENCOUNTER — Emergency Department (HOSPITAL_BASED_OUTPATIENT_CLINIC_OR_DEPARTMENT_OTHER): Payer: Managed Care, Other (non HMO)

## 2022-03-04 ENCOUNTER — Other Ambulatory Visit: Payer: Self-pay

## 2022-03-04 ENCOUNTER — Encounter (HOSPITAL_BASED_OUTPATIENT_CLINIC_OR_DEPARTMENT_OTHER): Payer: Self-pay

## 2022-03-04 DIAGNOSIS — R0981 Nasal congestion: Secondary | ICD-10-CM | POA: Diagnosis present

## 2022-03-04 DIAGNOSIS — R9431 Abnormal electrocardiogram [ECG] [EKG]: Secondary | ICD-10-CM | POA: Diagnosis not present

## 2022-03-04 DIAGNOSIS — I10 Essential (primary) hypertension: Secondary | ICD-10-CM | POA: Diagnosis not present

## 2022-03-04 DIAGNOSIS — U071 COVID-19: Secondary | ICD-10-CM | POA: Diagnosis not present

## 2022-03-04 DIAGNOSIS — Z79899 Other long term (current) drug therapy: Secondary | ICD-10-CM | POA: Insufficient documentation

## 2022-03-04 LAB — RESP PANEL BY RT-PCR (FLU A&B, COVID) ARPGX2
Influenza A by PCR: NEGATIVE
Influenza B by PCR: NEGATIVE
SARS Coronavirus 2 by RT PCR: POSITIVE — AB

## 2022-03-04 NOTE — ED Triage Notes (Signed)
Patient states she tested positive for covid yesterday and today she is congested and having shortness of breath. Patient speaking in full sentences- saturations 100% on room air.  ?

## 2022-03-05 ENCOUNTER — Other Ambulatory Visit: Payer: Self-pay

## 2022-03-05 LAB — CBC WITH DIFFERENTIAL/PLATELET
Abs Immature Granulocytes: 0.01 10*3/uL (ref 0.00–0.07)
Basophils Absolute: 0 10*3/uL (ref 0.0–0.1)
Basophils Relative: 1 %
Eosinophils Absolute: 0 10*3/uL (ref 0.0–0.5)
Eosinophils Relative: 0 %
HCT: 40.5 % (ref 36.0–46.0)
Hemoglobin: 13.4 g/dL (ref 12.0–15.0)
Immature Granulocytes: 0 %
Lymphocytes Relative: 41 %
Lymphs Abs: 2.3 10*3/uL (ref 0.7–4.0)
MCH: 30.9 pg (ref 26.0–34.0)
MCHC: 33.1 g/dL (ref 30.0–36.0)
MCV: 93.3 fL (ref 80.0–100.0)
Monocytes Absolute: 0.7 10*3/uL (ref 0.1–1.0)
Monocytes Relative: 12 %
Neutro Abs: 2.6 10*3/uL (ref 1.7–7.7)
Neutrophils Relative %: 46 %
Platelets: 190 10*3/uL (ref 150–400)
RBC: 4.34 MIL/uL (ref 3.87–5.11)
RDW: 13.4 % (ref 11.5–15.5)
WBC: 5.6 10*3/uL (ref 4.0–10.5)
nRBC: 0 % (ref 0.0–0.2)

## 2022-03-05 LAB — TROPONIN I (HIGH SENSITIVITY)
Troponin I (High Sensitivity): 15 ng/L (ref <18)
Troponin I (High Sensitivity): 3 ng/L (ref <18)
Troponin I (High Sensitivity): 3 ng/L (ref <18)

## 2022-03-05 LAB — BASIC METABOLIC PANEL
Anion gap: 9 (ref 5–15)
BUN: <5 mg/dL — ABNORMAL LOW (ref 6–20)
CO2: 23 mmol/L (ref 22–32)
Calcium: 8.6 mg/dL — ABNORMAL LOW (ref 8.9–10.3)
Chloride: 107 mmol/L (ref 98–111)
Creatinine, Ser: 0.81 mg/dL (ref 0.44–1.00)
GFR, Estimated: >60 mL/min (ref >60)
Glucose, Bld: 92 mg/dL (ref 70–99)
Potassium: 3.5 mmol/L (ref 3.5–5.1)
Sodium: 139 mmol/L (ref 135–145)

## 2022-03-05 LAB — D-DIMER, QUANTITATIVE: D-Dimer, Quant: 0.37 ug/mL-FEU (ref 0.00–0.50)

## 2022-03-05 MED ORDER — FLUTICASONE PROPIONATE 50 MCG/ACT NA SUSP: 1.0000 | Freq: Every day | NASAL | 0 refills | Status: DC

## 2022-03-05 MED ORDER — NIRMATRELVIR/RITONAVIR (PAXLOVID)TABLET: 3.0000 | ORAL_TABLET | Freq: Two times a day (BID) | ORAL | 0 refills | Status: AC

## 2022-03-05 NOTE — ED Provider Notes (Addendum)
?[facility] EMER[facility]ENC[facility] DEPARTMENT ?Provider Note ? ? ?CSN: [id number] ?Arrival date & time: 03/04/22  2148 ? ?  ? ?History ? ?Chief Complaint  ?Patient presents with  ? Nasal Congestion  ? Shortness of Breath  ? ? ?Marilyn Burgess is a 51 y.o. female. ? ?Patient with a history of hypertension presenting with nasal congestion.  She tested positive for COVID yesterday.  States she states symptoms for the past 2 days including headache, chills, body aches, nasal congestion, cough and fatigue.  Does feel some shortness of breath.  No chest pain.  She did have some brief chest tightness around 7 PM because "I could not breathe through my nose" but this improved and has not recurred.  No abdominal pain, nausea or vomiting.  No history of acid reflux or ulcers.  No history of cardiac disease.  No sick contacts or travel.  She been taking Mucinex for congestion without relief and states when she stops taking Mucinex becomes more congested in her nose.  Cough is nonproductive.  No abdominal pain.  She is concerned because she has increased congestion in her nose every time she stops taking Mucinex. ? ?The history is provided by the patient and the spouse.  ?Shortness of Breath ?Associated symptoms: cough, fever and sore throat   ?Associated symptoms: no abdominal pain, no chest pain, no headaches and no vomiting   ? ?  ? ?Home Medications ?Prior to Admission medications   ?Medication Sig Start Date End Date Taking? Authorizing Provider  ?ALPRAZolam (XANAX) 0.5 MG tablet Take 0.5 mg by mouth 2 (two) times daily as needed for anxiety.    [provider]  ?dicyclomine (BENTYL) 20 MG tablet Take 1 tablet (20 mg total) by mouth 2 (two) times daily. ?Patient not taking: Reported on 05/30/2016 12/07/15   Charlann Lange, PA-C  ?Fiber, Guar Gum, CHEW Chew 1 tablet by mouth as needed (indigestion).    [provider]  ?lisinopril (PRINIVIL,ZESTRIL) 30 MG tablet Take 30 mg by mouth daily.    [provider]  ?metoCLOPramide (REGLAN) 10 MG tablet Take 1 tablet (10 mg total) by mouth every 6 (six) hours. ?Patient not taking: Reported on 12/07/2015 06/25/15   Charlann Lange, PA-C  ?metoprolol (LOPRESSOR) 50 MG tablet Take 25 mg by mouth daily. 05/27/16   [provider]  ?norethindrone (MICRONOR,CAMILA,ERRIN) 0.35 MG tablet Take 1 tablet by mouth at bedtime.    [provider]  ?ondansetron (ZOFRAN) 4 MG tablet Take 1 tablet (4 mg total) by mouth every 6 (six) hours. ?Patient not taking: Reported on 05/30/2016 12/07/15   Charlann Lange, PA-C  ?potassium chloride SA (KLOR-CON M) 20 MEQ tablet Take 1 tablet (20 mEq total) by mouth daily for 3 days. 01/23/22 01/26/22  Luna Fuse, MD  ?   ? ?Allergies    ?Patient has no known allergies.   ? ?Review of Systems   ?Review of Systems  ?Constitutional:  Positive for activity change, appetite change, chills, fatigue and fever.  ?HENT:  Positive for congestion, rhinorrhea and sore throat.   ?Respiratory:  Positive for cough and shortness of breath.   ?Cardiovascular:  Negative for chest pain.  ?Gastrointestinal:  Negative for abdominal pain, nausea and vomiting.  ?Genitourinary:  Negative for dysuria and hematuria.  ?Skin:  Negative for wound.  ?Neurological:  Negative for dizziness, weakness and headaches.  ? all other systems are negative except as noted in the HPI and PMH.  ? ?Physical Exam ?Updated Vital Signs ?BP Marland Kitchen)  146/90   Pulse 78   Temp 98.5 ?F (36.9 ?C) (Oral)   Resp 16   Ht '4\' 9"'$  (1.448 m)   Wt 52.6 kg   LMP  (LMP Unknown)   SpO2 100%   BMI 25.10 kg/m?  ?Physical Exam ?Vitals and nursing note reviewed.  ?Constitutional:   ?   General: She is not in acute distress. ?   Appearance: She is well-developed. She is not ill-appearing.  ?   Comments: No increased work of breathing. Speaking in full sentences  ?HENT:  ?   Head: Normocephalic and atraumatic.  ?   Nose: Congestion and rhinorrhea present.  ?   Mouth/Throat:  ?   Pharynx: No oropharyngeal  exudate.  ?Eyes:  ?   Conjunctiva/sclera: Conjunctivae normal.  ?   Pupils: Pupils are equal, round, and reactive to light.  ?Neck:  ?   Comments: No meningismus. ?Cardiovascular:  ?   Rate and Rhythm: Normal rate and regular rhythm.  ?   Heart sounds: Normal heart sounds. No murmur heard. ?Pulmonary:  ?   Effort: Pulmonary effort is normal. No respiratory distress.  ?   Breath sounds: Normal breath sounds.  ?Chest:  ?   Chest wall: No tenderness.  ?Abdominal:  ?   Palpations: Abdomen is soft.  ?   Tenderness: There is no abdominal tenderness. There is no guarding or rebound.  ?Musculoskeletal:     ?   General: No tenderness. Normal range of motion.  ?   Cervical back: Normal range of motion and neck supple.  ?Skin: ?   General: Skin is warm.  ?Neurological:  ?   General: No focal deficit present.  ?   Mental Status: She is alert and oriented to person, place, and time. Mental status is at baseline.  ?   Cranial Nerves: No cranial nerve deficit.  ?   Motor: No abnormal muscle tone.  ?   Coordination: Coordination normal.  ?   Comments:  5/5 strength throughout. CN 2-12 intact.Equal grip strength.   ?Psychiatric:     ?   Behavior: Behavior normal.  ? ? ?ED Results / Procedures / Treatments   ?Labs ?(all labs ordered are listed, but only abnormal results are displayed) ?Labs Reviewed  ?RESP PANEL BY RT-PCR (FLU A&B, COVID) ARPGX2 - Abnormal; Notable for the following components:  ?    Result Value  ? SARS Coronavirus 2 by RT PCR POSITIVE (*)   ? All other components within normal limits  ?BASIC METABOLIC PANEL - Abnormal; Notable for the following components:  ? BUN <5 (*)   ? Calcium 8.6 (*)   ? All other components within normal limits  ?CBC WITH DIFFERENTIAL/PLATELET  ?D-DIMER, QUANTITATIVE  ?TROPONIN I (HIGH SENSITIVITY)  ?TROPONIN I (HIGH SENSITIVITY)  ?TROPONIN I (HIGH SENSITIVITY)  ?TROPONIN I (HIGH SENSITIVITY)  ? ? ?EKG ?EKG Interpretation ? ?Date/Time:  Saturday March 05 2022 00:18:18 EDT ?Ventricular Rate:   69 ?PR Interval:  156 ?QRS Duration: 75 ?QT Interval:  374 ?QTC Calculation: 401 ?R Axis:   61 ?Text Interpretation: Sinus rhythm Probable left atrial enlargement Abnormal T, consider ischemia, anterior leads duplicate Confirmed by Ezequiel Essex [phone]) on 03/05/2022 4:07:41 AM ? ?Radiology ?DG Chest 2 View ? ?Result Date: 03/04/2022 ?CLINICAL DATA:  Short of breath, flu like symptoms EXAM: CHEST - 2 VIEW COMPARISON:  12/07/2015 FINDINGS: The heart size and mediastinal contours are within normal limits. Both lungs are clear. The visualized skeletal structures are unremarkable. IMPRESSION: No active  cardiopulmonary disease. Electronically Signed   By: Randa Ngo M.D.   On: 03/04/2022 22:52   ? ?Procedures ?Procedures  ? ? ?Medications Ordered in ED ?Medications - No data to display ? ?ED Course/ Medical Decision Making/ A&P ?  ?                        ?Medical Decision Making ?Amount and/or Complexity of Data Reviewed ?Labs: ordered. ?Radiology: ordered. ?ECG/medicine tests: ordered. ? ?Patient COVID-positive here with nasal congestion.  No increased work of breathing and no hypoxia.  Lungs are clear. ? ?COVID test is positive here.  Chest x-ray is negative for infiltrates. ? ?Results reviewed and interpreted by me.  No increased work of breathing or hypoxia. ? ?Patient concerned about the amount of nasal congestion she is having.  We will give course of nasal steroids and start Paxlovid after discussion of risk and benefits ? ?EKG today shows more pronounced T wave inversions in anterior leads compared to March 2023.  Lab work with troponins will be obtained ? ?EKG changes d/w Dr. Humphrey Rolls of cardiology. Troponins remain negative. D-dimer negative. Low suspicion for PE. Patient with no active chest pain. States she had some tightness in her chest since 7pm that lasted a few minutes and has had any recurrent pain since. ?Patient reports negative stress test 4 years ago.  ?Dr. Humphrey Rolls reviewed patient's EKG and  presentation and agrees she would benefit from outpatient stress test but does not need inpatient work-up at this time. ? ?Quarantine precautions discussed with patient.  She agrees to [employer] after discussion of risks

## 2022-03-05 NOTE — Discharge Instructions (Signed)
Keep yourself quarantined for total of 5 days.  Use Tylenol or Motrin as needed for aches and fever.  Your EKG is abnormal and you should follow-up with a cardiologist for a stress test.  Return to the ED with chest pain that occurs when you walk around, is associate with shortness of breath, nausea, vomiting, sweating, any other concerns ?

## 2022-03-08 ENCOUNTER — Ambulatory Visit: Payer: Managed Care, Other (non HMO) | Admitting: Cardiology

## 2022-03-15 DIAGNOSIS — K529 Noninfective gastroenteritis and colitis, unspecified: Secondary | ICD-10-CM

## 2022-03-15 HISTORY — DX: Noninfective gastroenteritis and colitis, unspecified: K52.9

## 2022-04-05 ENCOUNTER — Other Ambulatory Visit: Payer: Self-pay

## 2022-04-07 ENCOUNTER — Ambulatory Visit (INDEPENDENT_AMBULATORY_CARE_PROVIDER_SITE_OTHER): Payer: Managed Care, Other (non HMO) | Admitting: Cardiology

## 2022-04-07 ENCOUNTER — Encounter: Payer: Self-pay | Admitting: Cardiology

## 2022-04-07 VITALS — BP 112/72 | HR 63 | Ht <= 58 in | Wt 116.0 lb

## 2022-04-07 DIAGNOSIS — I10 Essential (primary) hypertension: Secondary | ICD-10-CM

## 2022-04-07 DIAGNOSIS — R079 Chest pain, unspecified: Secondary | ICD-10-CM

## 2022-04-07 NOTE — Patient Instructions (Signed)
Medication Instructions:  Your physician recommends that you continue on your current medications as directed. Please refer to the Current Medication list given to you today.  *If you need a refill on your cardiac medications before your next appointment, please call your pharmacy*   Lab Work: None ordered If you have labs (blood work) drawn today and your tests are completely normal, you will receive your results only by: [place] (if you have MyChart) OR A paper copy in the mail If you have any lab test that is abnormal or we need to change your treatment, we will call you to review the results.   Testing/Procedures:      Stress Echocardiogram Information Sheet                                                      Instructions:    1. You may take your morning medications the morning of the test  2. Light breakfast.  3. Dress prepared to exercise.  4. DO NOT use ANY caffeine or tobacco products 3 hours before appointment.  5. Please bring all current prescription medications.    Follow-Up: At [facility], you and your health needs are our priority.  As part of our continuing mission to provide you with exceptional heart care, we have created designated Provider Care Teams.  These Care Teams include your primary Cardiologist (physician) and Advanced Practice Providers (APPs -  Physician Assistants and Nurse Practitioners) who all work together to provide you with the care you need, when you need it.  We recommend signing up for the patient portal called "MyChart".  Sign up information is provided on this After Visit Summary.  MyChart is used to connect with patients for Virtual Visits (Telemedicine).  Patients are able to view lab/test results, encounter notes, upcoming appointments, etc.  Non-urgent messages can be sent to your provider as well.   To learn more about what you can do with MyChart, go to [email or web address].    Your next appointment:   As  needed  The format for your next appointment:   In Person  Provider:   Jyl Heinz, MD   Other Instructions Exercise Stress Echocardiogram An exercise stress echocardiogram is a test to check how well your heart is working. This test uses sound waves and a computer to make pictures of your heart. These pictures will be taken before and after you exercise. For this test, you will walk on a treadmill or ride a bicycle to make your heart beat faster. While you exercise, your heart will be checked with an electrocardiogram (ECG). Your blood pressure will also be checked. You may have this test if: You have chest pain or a heart problem. You had a heart attack or heart surgery not long ago. You have heart valve problems. You have a condition that causes narrowing of the blood vessels that supply your heart. You have a high risk of heart disease and: You are starting a new exercise program. You need to have a big surgery. Tell a doctor about: Any allergies you have. All medicines you are taking. This includes vitamins, herbs, eye drops, creams, and over-the-counter medicines. Any problems you or family members have had with medicines that make you fall asleep (anesthetic medicines). Any surgeries you have had. Any blood disorders you have. Any medical  conditions you have. Whether you are pregnant or Matlin be pregnant. What are the risks? Generally, this is a safe test. However, problems Matthes occur, including: Chest pain. Feeling dizzy or light-headed. Shortness of breath. Increased or irregular heartbeat. Feeling like you Melaragno vomit (nausea) or vomiting. Heart attack. This is very rare. What happens before the test? Medicines Ask your doctor about changing or stopping your normal medicines. This is important if you take diabetes medicines or blood thinners. If you use an inhaler, bring it to the test. General instructions Wear comfortable clothes and walking shoes. Follow  instructions from your doctor about what you cannot eat or drink before the test. Do not drink or eat anything that has caffeine in it. Stop having caffeine 24 hours before the test. Do not smoke or use products that contain nicotine or tobacco for 4 hours before the test. If you need help quitting, ask your doctor. What happens during the test?  You will take off your clothes from the waist up and put on a hospital gown. Electrodes or patches will be put on your chest. A blood pressure cuff will be put on your arm. Before you exercise, a computer will make a picture of your heart. To do this: You will lie down and a gel will be put on your chest. A wand will be moved over the gel. Sound waves from the wand will go to the computer to make the picture. Then, you will start to exercise. You Liby walk on a treadmill or pedal a bicycle. Your blood pressure and heart rhythm will be checked while you exercise. The exercise will get harder or faster. You will exercise until: Your heart reaches a certain level. You are too tired to go on. You cannot go on because of chest pain, weakness, or dizziness. You will lie down right away so another picture of your heart can be taken. The procedure Labrum vary among doctors and hospitals. What can I expect after the test? After your test, it is common to have: Mild soreness. Mild tiredness. Your heart rate and blood pressure will be checked until they return to your normal levels. You should not have any new symptoms after this test. Follow these instructions at home: If your doctor says that you can, you Panos: Eat what you normally eat. Do your normal activities. Take over-the-counter and prescription medicines only as told by your doctor. Keep all follow-up visits. It is up to you to get the results of your test. Ask how to get your results when they are ready. Contact a doctor if: You feel dizzy or light-headed. You have a fast or irregular  heartbeat. You feel like you Wiater vomit or you vomit. You have a headache. You feel short of breath. Get help right away if: You develop pain or pressure: In your chest. In your jaw or neck. Between your shoulders. That goes down your left arm. You faint. You have trouble breathing. These symptoms Mungin be an emergency. Get medical help right away. Call your local emergency services (911 in the [place] Do not wait to see if the symptoms will go away. Do not drive yourself to the hospital. Summary This is a test that checks how well your heart is working. Follow instructions about what you cannot eat or drink before the test. Ask your doctor if you should take your normal medicines before the test. Stop having caffeine 24 hours before the test. Do not smoke or use products with nicotine  or tobacco in them for 4 hours before the test. During the test, your blood pressure and heart rhythm will be checked while you exercise. This information is not intended to replace advice given to you by your health care provider. Make sure you discuss any questions you have with your health care provider. Document Revised: 07/21/2021 Document Reviewed: 06/30/2020 Elsevier Patient Education  2022 [employer].

## 2022-04-07 NOTE — Progress Notes (Signed)
Cardiology Office Note:    Date:  04/07/2022   ID:  Marilyn Burgess, DOB [birth year], MRN [id number]  PCP:  Ardith Dark, PA-C  Cardiologist:  Jenean Lindau, MD   Referring MD: Ezequiel Essex, MD    ASSESSMENT:    1. Hypertension, benign   2. Chest pain of uncertain etiology    PLAN:    In order of problems listed above:  Primary prevention stressed with the patient.  Importance of compliance with diet medication stressed and she vocalized understanding.  I told her to get her lipids checked by primary care for primary prevention and health maintenance and she understands. Chest pain: Atypical for coronary etiology however because of the fact that she is concerned about it I will do an exercise stress echo.  This will also give me some additional information about cardiac anatomy and give me an idea of the left ventricle to see if there is any issue of left ventricular hypertrophy or such. Essential hypertension: Blood pressure is borderline I feel that in the future it is possible that she could do lifestyle modification and come off medications.  This I will leave to the opinion of her primary care.  CBC and follow-up appointment on a as needed basis only.  Patient had multiple questions which were answered to her satisfaction.   Medication Adjustments/Labs and Tests Ordered: Current medicines are reviewed at length with the patient today.  Concerns regarding medicines are outlined above.  No orders of the defined types were placed in this encounter.  No orders of the defined types were placed in this encounter.    History of Present Illness:    Marilyn Burgess is a 51 y.o. female who is being seen today for the evaluation of chest pain and palpitations at the request of Rancour, Annie Main, MD. patient is a pleasant 51 year old female.  She has past medical history of essential hypertension.  Patient mentions to me that she has some chest discomfort at times not related to  exertion.  She leads a sedentary lifestyle.  She used to exercise in the past but recently has slowed down.  Also she denies being sexually active.  She denies any orthopnea PND or any syncope.  For this reason she was here for an evaluation.  At the time of my evaluation, the patient is alert awake oriented and in no distress.  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 STORY: Hospitalized at [facility] 05/2008 d/t severe anxiety attack`E1o3L`IMPRESSION: Continue alprazolam as needed for sleep.  She does not wish to use anything else at this time.  Father is very sick with hepatic carcinoma.  Daughter is pregnant   Asymptomatic varicose veins of unspecified lower extremity 04/09/2014   Formatting of this note might be different from the original. 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 IMPRESSION: The goal for  blood pressure is less than 140/90.  `E1o3L`If you are checking your blood pressure at home, please record it and bring it to your next office visit. Following the Dietary Approaches to Stop Hypertension (DASH) diet (3 servings of fruit and vegetables daily, whole grains, low sodium, low-fat proteins) and exerci   Hypokalemia 10/06/2011   Formatting of this note might be different from the original. IMPRESSION: Potassium is 3.9 on recheck.  Continue the potassium chloride supplementation daily.   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 Possibly BPPV of uncertain laterality vs Meniere's.   Vitamin D deficiency 04/09/2014    Past Surgical History:  Procedure Laterality Date   ABDOMINAL EXPLORATION SURGERY      Current Medications: Current Meds  Medication Sig   ALPRAZolam (XANAX) 0.5 MG tablet Take 0.5 mg by mouth 2 (two) times daily as needed for anxiety.   Cholecalciferol (VITAMIN D3 PO) Take 2 tablets by mouth daily.   clindamycin (CLINDAGEL) 1 % gel Apply 1 application. topically as needed (rash).   Fluocin-Hydroquinone-Tretinoin (TRI-LUMA EX) Apply 1 application. topically at bedtime.   fluticasone (FLONASE) 50 MCG/ACT nasal spray Place into both nostrils as needed for allergies or rhinitis.   lisinopril-hydrochlorothiazide (ZESTORETIC) 20-12.5 MG tablet Take 1 tablet by mouth daily.   metoprolol (LOPRESSOR) 50 MG tablet Take 25 mg by mouth daily.   Multiple Vitamin (MULTIVITAMIN PO) Take 1 tablet by mouth daily.   norethindrone (MICRONOR,CAMILA,ERRIN) 0.35 MG tablet Take 1 tablet by mouth at bedtime.   valACYclovir (VALTREX)  1000 MG tablet Take 1,000 mg by mouth daily.     Allergies:   Patient has no known allergies.   Social History   Socioeconomic History   Marital status: Divorced    Spouse name: Not on file   Number of children: Not on file   Years of education: Not on file   Highest education level: Not on file  Occupational History   Not on file  Tobacco Use   Smoking status: Never   Smokeless tobacco: Not on file  Vaping Use   Vaping Use: Never used  Substance and Sexual Activity   Alcohol use: Yes    Comment: 1-2 times a week   Drug use: No   Sexual activity: Yes  Other Topics Concern   Not on file  Social History Narrative   Not on file   Social Determinants of Health   Financial Resource Strain: Not on file  Food Insecurity: Not on file  Transportation Needs: Not on file  Physical Activity: Not on file  Stress: Not on file  Social Connections: Not on file     Family History: The patient's family history includes Cirrhosis  in her father; Hypertension in her brother, brother, mother, and another family member. There is no history of Diabetes, Cancer, or Heart disease.  ROS:   Please see the history of present illness.    All other systems reviewed and are negative.  EKGs/Labs/Other Studies Reviewed:    The following studies were reviewed today: EKG reveals sinus rhythm and nonspecific ST-T changes   Recent Labs: 01/23/2022: ALT 21 03/05/2022: BUN <5; Creatinine, Ser 0.81; Hemoglobin 13.4; Platelets 190; Potassium 3.5; Sodium 139  Recent Lipid Panel No results found for: CHOL, TRIG, HDL, CHOLHDL, VLDL, LDLCALC, LDLDIRECT  Physical Exam:    VS:  BP 112/72   Pulse 63   Ht '4\' 9"'$  (1.448 m)   Wt 116 lb 0.6 oz (52.6 kg)   SpO2 98%   BMI 25.11 kg/m     Wt Readings from Last 3 Encounters:  04/07/22 116 lb 0.6 oz (52.6 kg)  03/04/22 116 lb (52.6 kg)  01/23/22 116 lb (52.6 kg)     GEN: Patient is in no acute distress HEENT: Normal NECK: No JVD; No carotid  bruits LYMPHATICS: No lymphadenopathy CARDIAC: S1 S2 regular, 2/6 systolic murmur at the apex. RESPIRATORY:  Clear to auscultation without rales, wheezing or rhonchi  ABDOMEN: Soft, non-tender, non-distended MUSCULOSKELETAL:  No edema; No deformity  SKIN: Warm and dry NEUROLOGIC:  Alert and oriented x 3 PSYCHIATRIC:  Normal affect    Signed, Jenean Lindau, MD  04/07/2022 3:18 PM    [facility]

## 2022-04-27 ENCOUNTER — Telehealth (HOSPITAL_COMMUNITY): Payer: Self-pay | Admitting: *Deleted

## 2022-04-27 NOTE — Telephone Encounter (Signed)
Left message on voicemail per DPR in reference to upcoming appointment scheduled on 05/04/22 at 1405 with detailed instructions given per Stress Test Requisition Sheet for the test. LM to arrive 30 minutes early, and that it is imperative to arrive on time for appointment to keep from having the test rescheduled. If you need to cancel or reschedule your appointment, please call the office within 24 hours of your appointment. Failure to do so may result in a cancellation of your appointment, and a $50 no show fee. Phone number given for call back for any questions. Auther Lyerly, Ranae [place]

## 2022-05-04 ENCOUNTER — Ambulatory Visit (HOSPITAL_COMMUNITY): Payer: Managed Care, Other (non HMO) | Attending: Internal Medicine

## 2022-05-04 ENCOUNTER — Ambulatory Visit (HOSPITAL_BASED_OUTPATIENT_CLINIC_OR_DEPARTMENT_OTHER): Payer: Managed Care, Other (non HMO)

## 2022-05-04 DIAGNOSIS — R079 Chest pain, unspecified: Secondary | ICD-10-CM

## 2022-05-04 LAB — ECHOCARDIOGRAM STRESS TEST
Area-P 1/2: 3.12 cm2
S' Lateral: 2.2 cm

## 2022-05-04 MED ORDER — PERFLUTREN LIPID MICROSPHERE
1.0000 mL | INTRAVENOUS | Status: AC | PRN
  Administered 2022-05-04 (×3): 2 mL via INTRAVENOUS

## 2022-05-06 ENCOUNTER — Telehealth: Payer: Self-pay

## 2022-05-06 NOTE — Telephone Encounter (Signed)
Results reviewed with pt as per Dr. Revankar's note.  Pt verbalized understanding and had no additional questions. Routed to PCP.  

## 2022-12-01 ENCOUNTER — Other Ambulatory Visit (HOSPITAL_COMMUNITY): Payer: Self-pay

## 2022-12-01 ENCOUNTER — Observation Stay (HOSPITAL_COMMUNITY)
Admission: EM | Admit: 2022-12-01 | Discharge: 2022-12-01 | Disposition: A | Discharge status: Home / Self Care | Payer: No Typology Code available for payment source | Attending: Family Medicine | Admitting: Family Medicine

## 2022-12-01 ENCOUNTER — Other Ambulatory Visit: Payer: Self-pay

## 2022-12-01 ENCOUNTER — Inpatient Hospital Stay (HOSPITAL_COMMUNITY): Payer: No Typology Code available for payment source

## 2022-12-01 ENCOUNTER — Encounter (HOSPITAL_COMMUNITY): Payer: Self-pay

## 2022-12-01 ENCOUNTER — Emergency Department (HOSPITAL_COMMUNITY): Payer: No Typology Code available for payment source

## 2022-12-01 DIAGNOSIS — K56609 Unspecified intestinal obstruction, unspecified as to partial versus complete obstruction: Principal | ICD-10-CM

## 2022-12-01 DIAGNOSIS — E876 Hypokalemia: Secondary | ICD-10-CM | POA: Diagnosis not present

## 2022-12-01 DIAGNOSIS — I10 Essential (primary) hypertension: Secondary | ICD-10-CM | POA: Diagnosis not present

## 2022-12-01 DIAGNOSIS — K56600 Partial intestinal obstruction, unspecified as to cause: Principal | ICD-10-CM | POA: Insufficient documentation

## 2022-12-01 DIAGNOSIS — K5730 Diverticulosis of large intestine without perforation or abscess without bleeding: Secondary | ICD-10-CM | POA: Diagnosis not present

## 2022-12-01 DIAGNOSIS — R1033 Periumbilical pain: Secondary | ICD-10-CM | POA: Diagnosis present

## 2022-12-01 DIAGNOSIS — Z79899 Other long term (current) drug therapy: Secondary | ICD-10-CM | POA: Insufficient documentation

## 2022-12-01 DIAGNOSIS — F410 Panic disorder [episodic paroxysmal anxiety] without agoraphobia: Secondary | ICD-10-CM | POA: Diagnosis present

## 2022-12-01 DIAGNOSIS — E860 Dehydration: Secondary | ICD-10-CM | POA: Insufficient documentation

## 2022-12-01 LAB — COMPREHENSIVE METABOLIC PANEL
ALT: 19 U/L (ref 0–44)
AST: 23 U/L (ref 15–41)
Albumin: 4.2 g/dL (ref 3.5–5.0)
Alkaline Phosphatase: 59 U/L (ref 38–126)
Anion gap: 8 (ref 5–15)
BUN: 13 mg/dL (ref 6–20)
CO2: 25 mmol/L (ref 22–32)
Calcium: 9.2 mg/dL (ref 8.9–10.3)
Chloride: 103 mmol/L (ref 98–111)
Creatinine, Ser: 0.9 mg/dL (ref 0.44–1.00)
GFR, Estimated: >60 mL/min (ref >60)
Glucose, Bld: 134 mg/dL — ABNORMAL HIGH (ref 70–99)
Potassium: 3.4 mmol/L — ABNORMAL LOW (ref 3.5–5.1)
Sodium: 136 mmol/L (ref 135–145)
Total Bilirubin: 0.9 mg/dL (ref 0.3–1.2)
Total Protein: 7.8 g/dL (ref 6.5–8.1)

## 2022-12-01 LAB — URINALYSIS, ROUTINE W REFLEX MICROSCOPIC
Bilirubin Urine: NEGATIVE
Glucose, UA: NEGATIVE mg/dL
Hgb urine dipstick: NEGATIVE
Ketones, ur: NEGATIVE mg/dL
Nitrite: NEGATIVE
Protein, ur: NEGATIVE mg/dL
Specific Gravity, Urine: 1.03 (ref 1.005–1.030)
pH: 7 (ref 5.0–8.0)

## 2022-12-01 LAB — CBC WITH DIFFERENTIAL/PLATELET
Abs Immature Granulocytes: 0.03 10*3/uL (ref 0.00–0.07)
Basophils Absolute: 0 10*3/uL (ref 0.0–0.1)
Basophils Relative: 0 %
Eosinophils Absolute: 0 10*3/uL (ref 0.0–0.5)
Eosinophils Relative: 0 %
HCT: 41 % (ref 36.0–46.0)
Hemoglobin: 14 g/dL (ref 12.0–15.0)
Immature Granulocytes: 0 %
Lymphocytes Relative: 12 %
Lymphs Abs: 1.1 10*3/uL (ref 0.7–4.0)
MCH: 32 pg (ref 26.0–34.0)
MCHC: 34.1 g/dL (ref 30.0–36.0)
MCV: 93.6 fL (ref 80.0–100.0)
Monocytes Absolute: 0.3 10*3/uL (ref 0.1–1.0)
Monocytes Relative: 3 %
Neutro Abs: 7.8 10*3/uL — ABNORMAL HIGH (ref 1.7–7.7)
Neutrophils Relative %: 85 %
Platelets: 305 10*3/uL (ref 150–400)
RBC: 4.38 MIL/uL (ref 3.87–5.11)
RDW: 12.2 % (ref 11.5–15.5)
WBC: 9.3 10*3/uL (ref 4.0–10.5)
nRBC: 0 % (ref 0.0–0.2)

## 2022-12-01 LAB — I-STAT BETA HCG BLOOD, ED (MC, WL, AP ONLY): I-stat hCG, quantitative: <5 m[IU]/mL (ref <5)

## 2022-12-01 LAB — MAGNESIUM: Magnesium: 2.2 mg/dL (ref 1.7–2.4)

## 2022-12-01 LAB — LIPASE, BLOOD: Lipase: 31 U/L (ref 11–51)

## 2022-12-01 MED ORDER — ONDANSETRON HCL 4 MG/2ML IJ SOLN: 4.0000 mg | Freq: Four times a day (QID) | INTRAMUSCULAR | Status: DC | PRN

## 2022-12-01 MED ORDER — POTASSIUM CHLORIDE CRYS ER 10 MEQ PO TBCR
10.0000 meq | EXTENDED_RELEASE_TABLET | Freq: Two times a day (BID) | ORAL | 0 refills | Status: DC
  Filled 2022-12-01: qty 5, 3d supply, fill #0

## 2022-12-01 MED ORDER — VITAMIN D 25 MCG (1000 UNIT) PO TABS
1000.0000 [IU] | ORAL_TABLET | Freq: Every day | ORAL | Status: DC
  Administered 2022-12-01: 1000 [IU] via ORAL
  Filled 2022-12-01: qty 1

## 2022-12-01 MED ORDER — NALOXONE HCL 0.4 MG/ML IJ SOLN: 0.4000 mg | INTRAMUSCULAR | Status: DC | PRN

## 2022-12-01 MED ORDER — LACTATED RINGERS IV SOLN: INTRAVENOUS | Status: DC

## 2022-12-01 MED ORDER — ACETAMINOPHEN 650 MG RE SUPP: 650.0000 mg | Freq: Four times a day (QID) | RECTAL | Status: DC | PRN

## 2022-12-01 MED ORDER — ACETAMINOPHEN 325 MG PO TABS: 650.0000 mg | ORAL_TABLET | Freq: Four times a day (QID) | ORAL | Status: DC | PRN

## 2022-12-01 MED ORDER — KETOROLAC TROMETHAMINE 30 MG/ML IJ SOLN: 30.0000 mg | Freq: Four times a day (QID) | INTRAMUSCULAR | Status: DC | PRN

## 2022-12-01 MED ORDER — ALPRAZOLAM 0.5 MG PO TABS: 0.5000 mg | ORAL_TABLET | Freq: Two times a day (BID) | ORAL | Status: DC | PRN

## 2022-12-01 MED ORDER — KCL-LACTATED RINGERS 20 MEQ/L IV SOLN
INTRAVENOUS | Status: DC
  Filled 2022-12-01 (×2): qty 1000

## 2022-12-01 MED ORDER — HYDROMORPHONE HCL 1 MG/ML IJ SOLN
0.5000 mg | Freq: Once | INTRAMUSCULAR | Status: AC
  Administered 2022-12-01: 0.5 mg via INTRAVENOUS
  Filled 2022-12-01: qty 1

## 2022-12-01 MED ORDER — LISINOPRIL 10 MG PO TABS
20.0000 mg | ORAL_TABLET | Freq: Once | ORAL | Status: AC
  Administered 2022-12-01: 20 mg via ORAL
  Filled 2022-12-01: qty 2

## 2022-12-01 MED ORDER — IOHEXOL 300 MG/ML  SOLN
80.0000 mL | Freq: Once | INTRAMUSCULAR | Status: AC | PRN
Start: 2022-12-01 — End: 2022-12-01
  Administered 2022-12-01: 80 mL via INTRAVENOUS

## 2022-12-01 MED ORDER — NORETHINDRONE 0.35 MG PO TABS: 1.0000 | ORAL_TABLET | Freq: Every day | ORAL | Status: DC

## 2022-12-01 MED ORDER — ONDANSETRON HCL 4 MG PO TABS: 4.0000 mg | ORAL_TABLET | Freq: Four times a day (QID) | ORAL | Status: DC | PRN

## 2022-12-01 MED ORDER — HYDROMORPHONE HCL 1 MG/ML IJ SOLN: 0.5000 mg | INTRAMUSCULAR | Status: DC | PRN

## 2022-12-01 MED ORDER — SODIUM CHLORIDE 0.9 % IV BOLUS
1000.0000 mL | Freq: Once | INTRAVENOUS | Status: AC
  Administered 2022-12-01: 1000 mL via INTRAVENOUS

## 2022-12-01 MED ORDER — ADULT MULTIVITAMIN W/MINERALS CH
1.0000 | ORAL_TABLET | Freq: Every day | ORAL | Status: DC
  Administered 2022-12-01: 1 via ORAL
  Filled 2022-12-01: qty 1

## 2022-12-01 MED ORDER — BENZOCAINE 20 % MT AERO
INHALATION_SPRAY | Freq: Once | OROMUCOSAL | Status: AC
  Filled 2022-12-01: qty 57

## 2022-12-01 MED ORDER — FLUTICASONE PROPIONATE 50 MCG/ACT NA SUSP
1.0000 | NASAL | Status: DC | PRN
  Filled 2022-12-01: qty 16

## 2022-12-01 MED ORDER — METOPROLOL TARTRATE 25 MG PO TABS
25.0000 mg | ORAL_TABLET | Freq: Every day | ORAL | Status: DC
  Administered 2022-12-01: 25 mg via ORAL
  Filled 2022-12-01: qty 1

## 2022-12-01 MED ORDER — ONDANSETRON HCL 4 MG/2ML IJ SOLN
4.0000 mg | Freq: Once | INTRAMUSCULAR | Status: AC
  Administered 2022-12-01: 4 mg via INTRAVENOUS
  Filled 2022-12-01: qty 2

## 2022-12-01 MED ORDER — POTASSIUM CHLORIDE 2 MEQ/ML IV SOLN
INTRAVENOUS | Status: DC
  Filled 2022-12-01 (×2): qty 1000

## 2022-12-01 MED ORDER — HYDROMORPHONE HCL 1 MG/ML IJ SOLN: 0.5000 mg | Freq: Once | INTRAMUSCULAR | Status: DC

## 2022-12-01 MED ORDER — ENOXAPARIN SODIUM 40 MG/0.4ML IJ SOSY
40.0000 mg | PREFILLED_SYRINGE | INTRAMUSCULAR | Status: DC
  Administered 2022-12-01: 40 mg via SUBCUTANEOUS
  Filled 2022-12-01: qty 0.4

## 2022-12-01 NOTE — Hospital Course (Addendum)
Mrs. Muhlestein is a 52 y.o. F with anxiety, HTN and prior SBO who presented with 1 day abdominal pain.  1/11: CT in the ER showed SBO, admitted

## 2022-12-01 NOTE — Discharge Summary (Signed)
Physician Discharge Summary   Patient: Marilyn Burgess MRN: [id number] DOB: Sep 15, 1971  Admit date:     12/01/2022  Discharge date: 12/01/22  Discharge Physician: Marilyn Burgess   PCP: Marilyn Dark, PA-C     Recommendations at discharge:  Follow up with PCP Please check BMP in 1 week     Discharge Diagnoses: Principal Problem:   Partial bowel obstruction Active Problems:   Hypertension, benign   Generalized anxiety disorder with panic attacks   Hypokalemia     Hospital Course: Marilyn Burgess is a 52 y.o. F with anxiety, HTN and prior SBO who presented with 1 day abdominal pain.   In the ER, CT showed a partial bowel obstruction.     She was started on fluids, IV analgesics and her symptoms improved.     NG tube was attempted, but failed, and in the meantime, patient's symptoms resolved.  Follow up x-ray showed no signs of bowel obstruction.  She was able to tolerate a diet and was passing flatus and felt at her baseline and was discharged with PCP follow up.             The [facility] Controlled Substances Registry was reviewed for this patient prior to discharge.   Consultants: None Procedures performed: CT abdomen and pelvis  Disposition: Home   DISCHARGE MEDICATION: Allergies as of 12/01/2022   No Known Allergies      Medication List     TAKE these medications    acetaminophen 500 MG tablet Commonly known as: TYLENOL Take 1,000 mg by mouth as needed for moderate pain.   ALPRAZolam 0.5 MG tablet Commonly known as: XANAX Take 0.5 mg by mouth as needed for anxiety.   BIOTIN PO Take 2 capsules by mouth daily.   clindamycin 1 % gel Commonly known as: CLINDAGEL Apply 1 application. topically as needed (rash).   lisinopril-hydrochlorothiazide 20-12.5 MG tablet Commonly known as: ZESTORETIC Take 1 tablet by mouth daily.   metoprolol tartrate 50 MG tablet Commonly known as: LOPRESSOR Take 25 mg by mouth daily.    MULTIVITAMIN PO Take 2 tablets by mouth daily.   norethindrone 0.35 MG tablet Commonly known as: MICRONOR Take 1 tablet by mouth at bedtime.   potassium chloride 10 MEQ tablet Commonly known as: KLOR-CON M Take 1 tablet (10 mEq total) by mouth 2 (two) times daily.   tretinoin 0.025 % cream Commonly known as: RETIN-A Apply 1 Application topically daily.   TRI-LUMA EX Apply 1 application  topically daily.   TUMS PO Take 2 tablets by mouth as needed (heartburn).   valACYclovir 1000 MG tablet Commonly known as: VALTREX Take 1,000 mg by mouth daily.         Discharge Instructions     Discharge instructions   Complete by: As directed    **IMPORTANT DISCHARGE INSTRUCTIONS**   From Marilyn Burgess: You were admitted for a bowel obstruction Thankfully, this resolved quickly, and you were able to eat and drink. You should stick to a soft diet for the next few days (soups, applesauce, pudding, mashed potatoes) then advance to normal  Resume your normal home medicines  Go see your primary care doctor in 1 week and have them check your lab studies   Increase activity slowly   Complete by: As directed        Discharge Exam: Filed Weights   12/01/22 0028  Weight: 53.5 kg    General: Pt is alert, awake, not in acute distress Cardiovascular: RRR, nl S1-S2,  no murmurs appreciated.   No LE edema.   Respiratory: Normal respiratory rate and rhythm.  CTAB without rales or wheezes. Abdominal: Abdomen soft and non-tender.  No distension or HSM.   Neuro/Psych: Strength symmetric in upper and lower extremities.  Judgment and insight appear normal.   Condition at discharge: good  The results of significant diagnostics from this hospitalization (including imaging, microbiology, ancillary and laboratory) are listed below for reference.   Imaging Studies: DG Abd 1 View  Result Date: 12/01/2022 CLINICAL DATA:  Small bowel obstruction EXAM: ABDOMEN - 1 VIEW COMPARISON:  CT  abdomen 12/01/2022 FINDINGS: There is some gas-filled loops of nondilated right lower quadrant small bowel. However, there multiple on opacified loops of small bowel and on the recent CT scan the mildly dilated small bowel loops were mainly fluid-filled and thus might not show up well at radiography. There is some formed stool in the colon. Contrast medium in the urinary bladder from recent CT scan. Surgical clips in the right upper quadrant were shown on recent CT to be just below the liver but the patient has not undergone cholecystectomy. IMPRESSION: 1. Essentially unremarkable bowel gas pattern. However, the mildly dilated small bowel loops were mainly fluid-filled on the recent CT scan, and thus might not show up well at conventional radiography. 2. Contrast medium in the urinary bladder from recent CT scan. Electronically Signed   By: Van Clines M.D.   On: 12/01/2022 09:01   CT ABDOMEN PELVIS W CONTRAST  Result Date: 12/01/2022 CLINICAL DATA:  Unspecified abdominal pain, bowel obstruction EXAM: CT ABDOMEN AND PELVIS WITH CONTRAST TECHNIQUE: Multidetector CT imaging of the abdomen and pelvis was performed using the standard protocol following bolus administration of intravenous contrast. RADIATION DOSE REDUCTION: This exam was performed according to the departmental dose-optimization program which includes automated exposure control, adjustment of the mA and/or kV according to patient size and/or use of iterative reconstruction technique. CONTRAST:  110m OMNIPAQUE IOHEXOL 300 MG/ML  SOLN COMPARISON:  05/31/2016 FINDINGS: Lower chest: No acute abnormality. Hepatobiliary: No focal liver abnormality is seen. No gallstones, gallbladder wall thickening, or biliary dilatation. Pancreas: Unremarkable Spleen: Unremarkable Adrenals/Urinary Tract: Adrenal glands are unremarkable. Kidneys are normal, without renal calculi, focal lesion, or hydronephrosis. Bladder is unremarkable. Stomach/Bowel: The stomach is  mildly fluid-filled and distended. There are multiple fluid-filled dilated loops of small bowel with fecalization of intraluminal contents related to stasis with a gradual transition to decompressed loops of bowel within the right lower quadrant of the abdomen in keeping with a high-grade partial or developing complete small bowel obstruction. This is best seen on sagittal image # 47/4 and axial image # 41/2 with subsequent transition to decompressed loops of distal small bowel. Mild to moderate pancolonic diverticulosis, most severe within the descending colon. Appendix normal. No free intraperitoneal gas. Trace ascites. Vascular/Lymphatic: No significant vascular findings are present. No enlarged abdominal or pelvic lymph nodes. Reproductive: Uterus and bilateral adnexa are unremarkable. Other: No abdominal wall hernia Musculoskeletal: No acute bone abnormality. No lytic or blastic bone lesion. IMPRESSION: 1. High-grade partial or developing complete small bowel obstruction with gradual transition to decompressed loops of bowel within the right lower quadrant of the abdomen. Trace ascites 2. Mild to moderate pancolonic diverticulosis without superimposed acute inflammatory change. Electronically Signed   By: AFidela SalisburyM.D.   On: 12/01/2022 04:02    Microbiology: Results for orders placed or performed during the hospital encounter of 03/04/22  Resp Panel by RT-PCR (Flu A&B, Covid) Nasopharyngeal Swab  Status: Abnormal   Collection Time: 03/04/22 10:24 PM   Specimen: Nasopharyngeal Swab; Nasopharyngeal(NP) swabs in vial transport medium  Result Value Ref Range Status   SARS Coronavirus 2 by RT PCR POSITIVE (A) NEGATIVE Final    Comment: (NOTE) SARS-CoV-2 target nucleic acids are DETECTED.  The SARS-CoV-2 RNA is generally detectable in upper respiratory specimens during the acute phase of infection. Positive results are indicative of the presence of the identified virus, but do not rule out  bacterial infection or co-infection with other pathogens not detected by the test. Clinical correlation with patient history and other diagnostic information is necessary to determine patient infection status. The expected result is Negative.  Fact Sheet for Patients: EntrepreneurPulse.com.au  Fact Sheet for Healthcare Providers: IncredibleEmployment.be  This test is not yet approved or cleared by the [place] FDA and  has been authorized for detection and/or diagnosis of SARS-CoV-2 by FDA under an Emergency Use Authorization (EUA).  This EUA will remain in effect (meaning this test can be used) for the duration of  the COVID-19 declaration under Section 564(b)(1) of the A ct, 21 U.S.C. section 360bbb-3(b)(1), unless the authorization is terminated or revoked sooner.     Influenza A by PCR NEGATIVE NEGATIVE Final   Influenza B by PCR NEGATIVE NEGATIVE Final    Comment: (NOTE) The Xpert Xpress SARS-CoV-2/FLU/RSV plus assay is intended as an aid in the diagnosis of influenza from Nasopharyngeal swab specimens and should not be used as a sole basis for treatment. Nasal washings and aspirates are unacceptable for Xpert Xpress SARS-CoV-2/FLU/RSV testing.  Fact Sheet for Patients: EntrepreneurPulse.com.au  Fact Sheet for Healthcare Providers: IncredibleEmployment.be  This test is not yet approved or cleared by the [place] FDA and has been authorized for detection and/or diagnosis of SARS-CoV-2 by FDA under an Emergency Use Authorization (EUA). This EUA will remain in effect (meaning this test can be used) for the duration of the COVID-19 declaration under Section 564(b)(1) of the Act, 21 U.S.C. section 360bbb-3(b)(1), unless the authorization is terminated or revoked.  Performed at [facility], [address] [place], [place] [address]     Labs: CBC: Recent Labs  Lab  12/01/22 0106  WBC 9.3  NEUTROABS 7.8*  HGB 14.0  HCT 41.0  MCV 93.6  PLT 169   Basic Metabolic Panel: Recent Labs  Lab 12/01/22 0106  NA 136  K 3.4*  CL 103  CO2 25  GLUCOSE 134*  BUN 13  CREATININE 0.90  CALCIUM 9.2  MG 2.2   Liver Function Tests: Recent Labs  Lab 12/01/22 0106  AST 23  ALT 19  ALKPHOS 59  BILITOT 0.9  PROT 7.8  ALBUMIN 4.2   CBG: No results for input(s): "GLUCAP" in the last 168 hours.  Discharge time spent: approximately 35 minutes spent on discharge counseling, evaluation of patient on day of discharge, and coordination of discharge planning with nursing, social work, pharmacy and case management  Signed: Edwin Dada, MD [facility] 12/01/2022

## 2022-12-01 NOTE — H&P (Addendum)
History and Physical    Patient: Marilyn Burgess IEP:[id number] DOB: 03-06-1971 DOA: 12/01/2022 DOS: the patient was seen and examined on 12/01/2022 PCP: Ardith Dark, PA-C  Patient coming from: Home  Chief Complaint:  Chief Complaint  Patient presents with   Abdominal Pain   HPI: Marilyn Burgess is a 52 y.o. female with medical history significant of bilateral lower extremity varicosities, fibrocystic breast disease, generalized anxiety disorder with panic attacks, 2 prior cesarean sections, history of small bowel obstruction requiring hospitalization x 2 without surgical intervention and vitamin D deficiency.  She presented to the ER with abrupt onset of abdominal pain x 1 day.  This was associated with some nausea and vomiting.  In the ED she was afebrile, mildly hypertensive, bradycardic in the context of recent emesis, and room air sats were 98%.  Labs were unremarkable except for potassium 3.4 and glucose of 134, i-STAT pregnancy test was negative.  CT of the abdomen and pelvis revealed high-grade partial or developing complete small bowel obstruction with gradual transition to decompressed loops of the bowel within the right lower quadrant of the abdomen.  Is a dental finding of diverticulosis without diverticulitis.  An NG tube was attempted but unsuccessful.  Hospitalist service requested to place patient in the hospital to treat evolving bowel obstruction.   Review of Systems: As mentioned in the history of present illness. All other systems reviewed and are negative. 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 STORY: Hospitalized at [facility] 05/2008 d/t severe anxiety attack`E1o3L`IMPRESSION: Continue alprazolam as needed for sleep.  She does not wish to use anything else at this time.  Father is very sick with  hepatic carcinoma.  Daughter is pregnant   Asymptomatic varicose veins of unspecified lower extremity 04/09/2014   Formatting of this note might be different from the original. 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 IMPRESSION: The goal for blood pressure is less than 140/90.  `E1o3L`If you are checking your blood pressure at home, please record it and bring it to your next office visit. Following the Dietary Approaches to Stop Hypertension (DASH) diet (3 servings of fruit and vegetables daily, whole grains, low sodium, low-fat proteins) and exerci   Hypokalemia 10/06/2011   Formatting of this note might be different from the original. IMPRESSION: Potassium is 3.9 on recheck.  Continue the potassium chloride supplementation daily.   Left shoulder pain 06/23/2014  Lichen planus 0/12/5850   Lipoma of back 06/14/2017   Oral contraceptive pill surveillance 11/30/2018   Primary osteoarthritis involving multiple joints 02/24/2015   SBO (small bowel obstruction) ([place]) 05/31/2016    Situational anxiety 04/21/2020   Stress headaches 01/15/2019   Vaginosis 05/29/2014   Vertigo 05/29/2014   Formatting of this note might be different from the original. Possibly BPPV of uncertain laterality vs Meniere's.   Vitamin D deficiency 04/09/2014   Past Surgical History:  Procedure Laterality Date   ABDOMINAL EXPLORATION SURGERY     Social History:  reports that she has never smoked. She does not have any smokeless tobacco history on file. She reports current alcohol use. She reports that she does not use drugs.  No Known Allergies  Family History  Problem Relation Age of Onset   Hypertension Mother    Cirrhosis Father    Hypertension Brother    Hypertension Brother    Hypertension Other    Diabetes Neg Hx    Cancer Neg Hx    Heart disease Neg Hx     Prior to Admission medications   Medication Sig Start Date End Date Taking? Authorizing Provider  ALPRAZolam Duanne Moron) 0.5 MG tablet Take 0.5 mg by mouth 2 (two) times daily as needed for anxiety.    [provider]  Cholecalciferol (VITAMIN D3 PO) Take 2 tablets by mouth daily.    [provider]  clindamycin (CLINDAGEL) 1 % gel Apply 1 application. topically as needed (rash).    [provider]  Fluocin-Hydroquinone-Tretinoin (TRI-LUMA EX) Apply 1 application. topically at bedtime.    [provider]  fluticasone (FLONASE) 50 MCG/ACT nasal spray Place into both nostrils as needed for allergies or rhinitis.    [provider]  lisinopril-hydrochlorothiazide (ZESTORETIC) 20-12.5 MG tablet Take 1 tablet by mouth daily. 01/10/22   [provider]  metoprolol (LOPRESSOR) 50 MG tablet Take 25 mg by mouth daily. 05/27/16   [provider]  Multiple Vitamin (MULTIVITAMIN PO) Take 1 tablet by mouth daily.    [provider]  norethindrone (MICRONOR,CAMILA,ERRIN) 0.35 MG tablet Take 1 tablet by mouth at bedtime.    [provider]  valACYclovir (VALTREX) 1000 MG  tablet Take 1,000 mg by mouth daily.    [provider]    Physical Exam: Vitals:   12/01/22 0430 12/01/22 0507 12/01/22 0602 12/01/22 0631  BP: (!) 108/94  115/86 120/82  Pulse: 84  72 73  Resp: '11  16 16  '$ Temp:  98.2 F (36.8 C)  97.8 F (36.6 C)  TempSrc:  Oral  Oral  SpO2: 99%  97% 100%  Weight:      Height:       Constitutional: NAD, calm, comfortable Respiratory: clear to auscultation bilaterally, no wheezing, no crackles. Normal respiratory effort. No accessory muscle use.  Cardiovascular: Regular rate and rhythm, no murmurs / rubs / gallops. No extremity edema. 2+ pedal pulses. Abdomen: no tenderness, abdomen nondistended. No hepatosplenomegaly.  Hyperactive bowel sounds positive.  + Flatus Musculoskeletal: no clubbing / cyanosis. No joint deformity upper and lower extremities. Good ROM, no contractures. Normal muscle tone.  Skin: no rashes, lesions, ulcers. No induration Neurologic: CN 2-12 grossly intact. Sensation intact, DTR normal. Strength 5/5 x all 4 extremities.  Psychiatric: Normal judgment and insight. Alert and oriented x 3. Normal mood.    Data Reviewed:  As per HPI  Assessment and Plan: Recurrent partial small bowel obstruction Patient with history of same x 2 in the  past w/ utilization of NG tube Staff unable to advance NGT  SBO symptoms seem to have resolved noting patient with nontender nondistended abdomen and hyperactive bowel sounds passing flatus.  She is also reporting hunger. Will trial clear liquids Check follow-up KUB Consider discharge in a.m.  Dehydration with hypokalemia Continue lactated Ringer's with potassium Hypokalemia secondary to recent emesis  Moderate pancolonic diverticulosis Incidental finding on CT scan No evidence of acute diverticulitis    Advance Care Planning:   Code Status: Full Code   VTE prophylaxis: Lovenox  Consults: None  Family Communication: None patient only  Severity of Illness: The  appropriate patient status for this patient is OBSERVATION. Observation status is judged to be reasonable and necessary in order to provide the required intensity of service to ensure the patient's safety. The patient's presenting symptoms, physical exam findings, and initial radiographic and laboratory data in the context of their medical condition is felt to place them at decreased risk for further clinical deterioration. Furthermore, it is anticipated that the patient will be medically stable for discharge from the hospital within 2 midnights of admission.   Hopeful can discharge later this afternoon  Author: Erin Hearing, NP 12/01/2022 6:34 AM  For on call review [email or web address].

## 2022-12-01 NOTE — Progress Notes (Signed)
Attempted NG tube x2, unsuccessful. Pt reports resolved pain and gas. Bowel sounds active in all 4 quadrants. Will notify provider.

## 2022-12-01 NOTE — ED Triage Notes (Signed)
Patient states that she started have severe abdominal pain around 1800. Sates she had a bowel obstruction 4 years ago, but states that this pain is worse. EMS gave patient 4 mg of Zofran, and 100 mcg of fentanyl. 18LAC

## 2022-12-01 NOTE — Progress Notes (Signed)
  Carryover admission to the Day Admitter.  I discussed this case with the EDP, Dr. Leonette Monarch.  Per these discussions:   This is a 52 year old female with history of multiple prior small bowel obstructions, most recent occurring 4 years ago, who is being admitted with small bowel obstruction after presenting with 1 day of lower abdominal discomfort similar in location, quality, intensity relative to the symptoms she was experiencing at the time of her previous small bowel obstructions, with this evening CT abdomen/pelvis confirming small bowel obstruction without evidence of bowel perforation or abscess.  She is in the process of having an NG tube placed.  I have placed an order for inpatient admission to [place] for further evaluation management of presenting small bowel obstruction.  I have placed some additional preliminary admit orders via the adult multi-morbid admission order set. I have also ordered n.p.o., prn IV Dilaudid, prn IV Zofran, continuous lactated Ringer's at 75 cc/h x 10 hours.  Add on serum magnesium level.    Babs Bertin, DO Hospitalist

## 2022-12-01 NOTE — ED Notes (Signed)
Attempted NG tube, unsuccessful

## 2022-12-01 NOTE — ED Provider Notes (Signed)
[facility] DEPT Provider Note  CSN: [id number] Arrival date & time: 12/01/22 0017  Chief Complaint(s) Abdominal Pain  HPI Marilyn Burgess is a 52 y.o. 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 STORY: Hospitalized at [facility] 05/2008 d/t severe anxiety attack`E1o3L`IMPRESSION: Continue alprazolam as needed for sleep.  She does not wish to use anything else at this time.  Father is very sick with hepatic carcinoma.  Daughter is pregnant   Asymptomatic varicose veins of unspecified lower extremity 04/09/2014   Formatting of this note might be different from the original. 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 IMPRESSION: The goal for blood pressure is less than 140/90.  `E1o3L`If you are checking your blood pressure at home, please record it and bring it to your next office visit. Following the Dietary Approaches to Stop Hypertension (DASH) diet (3 servings of fruit and vegetables daily, whole grains, low sodium, low-fat proteins) and exerci   Hypokalemia 10/06/2011   Formatting of this note might be different from the original. IMPRESSION: Potassium is 3.9 on recheck.  Continue the potassium chloride supplementation daily.   Left shoulder pain 06/25/1323   Lichen planus 4/0/1027   Lipoma of back 06/14/2017   Oral contraceptive pill surveillance 11/30/2018   Primary osteoarthritis involving multiple joints 02/24/2015   SBO (small bowel obstruction) ([place]) 05/31/2016   Situational anxiety 04/21/2020   Stress headaches 01/15/2019   Vaginosis 05/29/2014   Vertigo 05/29/2014   Formatting of this note might be different from the original. Possibly BPPV of uncertain laterality vs Meniere's.   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   Ganglion cyst of wrist, left 01/05/2021   Situational anxiety 04/21/2020   Cervical high risk HPV (human papillomavirus)  test positive 12/03/2019   Stress headaches 01/15/2019   Bilateral hearing loss 01/08/2019   Oral contraceptive pill surveillance 11/30/2018   Carpal tunnel syndrome of right wrist 12/12/2017   Generalized anxiety disorder with panic attacks 12/12/2017   Lipoma of back 06/14/2017   SBO  (small bowel obstruction) ([place]) 05/31/2016   Breast mass, right 03/10/2016   Primary osteoarthritis involving multiple joints 02/24/2015   Left shoulder pain 06/23/2014   Allergic urticaria 05/29/2014   Breakthrough bleeding on depo provera 05/29/2014   Fibrocystic disease of right breast 05/29/2014   Excessive sweating 05/29/2014   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 Authorizing Provider  ALPRAZolam Duanne Moron) 0.5 MG tablet Take 0.5 mg by mouth 2 (two) times daily as needed for anxiety.    [provider]  Cholecalciferol (VITAMIN D3 PO) Take 2 tablets by mouth daily.    [provider]  clindamycin (CLINDAGEL) 1 % gel Apply 1 application. topically as needed (rash).    [provider]  Fluocin-Hydroquinone-Tretinoin (TRI-LUMA EX) Apply 1 application. topically at bedtime.    [provider]  fluticasone (FLONASE) 50 MCG/ACT nasal spray Place into both nostrils as needed for allergies or rhinitis.    [provider]  lisinopril-hydrochlorothiazide (ZESTORETIC) 20-12.5 MG tablet Take 1 tablet by mouth daily. 01/10/22   [provider]  metoprolol (LOPRESSOR) 50 MG tablet Take 25 mg by mouth daily. 05/27/16   [provider]  Multiple Vitamin (MULTIVITAMIN PO) Take 1 tablet by mouth daily.    [provider]  norethindrone (MICRONOR,CAMILA,ERRIN) 0.35 MG tablet Take 1 tablet by mouth at bedtime.     [provider]  valACYclovir (VALTREX) 1000 MG tablet Take 1,000 mg by mouth daily.    [provider]                                                                                                                                    Allergies Patient has no known allergies.  Review of Systems Review of Systems  Constitutional:  Negative for fever.  Gastrointestinal:  Positive for abdominal pain and nausea. Negative for vomiting.  Genitourinary:  Negative for dysuria.   As noted in HPI  Physical Exam Vital Signs  I have reviewed the triage vital signs BP 123/83   Pulse (!) 59   Temp 98 F (36.7 C)   Resp 18   Ht '4\' 9"'$  (1.448 m)  Wt 53.5 kg   SpO2 99%   BMI 25.53 kg/m   Physical Exam Vitals reviewed.  Constitutional:      General: She is not in acute distress.    Appearance: She is well-developed. She is not diaphoretic.  HENT:     Head: Normocephalic and atraumatic.     Right Ear: External ear normal.     Left Ear: External ear normal.     Nose: Nose normal.  Eyes:     General: No scleral icterus.    Conjunctiva/sclera: Conjunctivae normal.  Neck:     Trachea: Phonation normal.  Cardiovascular:     Rate and Rhythm: Normal rate and regular rhythm.  Pulmonary:     Effort: Pulmonary effort is normal. No respiratory distress.     Breath sounds: No stridor.  Abdominal:     General: There is no distension.     Tenderness: There is abdominal tenderness in the periumbilical area and suprapubic area.    Musculoskeletal:        General: Normal range of motion.     Cervical back: Normal range of motion.  Neurological:     Mental Status: She is alert and oriented to person, place, and time.  Psychiatric:        Behavior: Behavior normal.     ED Results and Treatments Labs (all labs ordered are listed, but only abnormal results are displayed) Labs Reviewed  COMPREHENSIVE METABOLIC PANEL - Abnormal; Notable for the following components:       Result Value   Potassium 3.4 (*)    Glucose, Bld 134 (*)    All other components within normal limits  CBC WITH DIFFERENTIAL/PLATELET - Abnormal; Notable for the following components:   Neutro Abs 7.8 (*)    All other components within normal limits  LIPASE, BLOOD  URINALYSIS, ROUTINE W REFLEX MICROSCOPIC  I-STAT BETA HCG BLOOD, ED ([facility], [facility], [facility] ONLY)                                                                                                                         EKG  EKG Interpretation  Date/Time:    Ventricular Rate:    PR Interval:    QRS Duration:   QT Interval:    QTC Calculation:   R Axis:     Text Interpretation:         Radiology CT ABDOMEN PELVIS W CONTRAST  Result Date: 12/01/2022 CLINICAL DATA:  Unspecified abdominal pain, bowel obstruction EXAM: CT ABDOMEN AND PELVIS WITH CONTRAST TECHNIQUE: Multidetector CT imaging of the abdomen and pelvis was performed using the standard protocol following bolus administration of intravenous contrast. RADIATION DOSE REDUCTION: This exam was performed according to the departmental dose-optimization program which includes automated exposure control, adjustment of the mA and/or kV according to patient size and/or use of iterative reconstruction technique. CONTRAST:  50m OMNIPAQUE IOHEXOL 300 MG/ML  SOLN COMPARISON:  05/31/2016 FINDINGS: Lower chest: No acute abnormality. Hepatobiliary: No focal liver abnormality  is seen. No gallstones, gallbladder wall thickening, or biliary dilatation. Pancreas: Unremarkable Spleen: Unremarkable Adrenals/Urinary Tract: Adrenal glands are unremarkable. Kidneys are normal, without renal calculi, focal lesion, or hydronephrosis. Bladder is unremarkable. Stomach/Bowel: The stomach is mildly fluid-filled and distended. There are multiple fluid-filled dilated loops of small bowel with fecalization of intraluminal contents related to stasis with a gradual transition to decompressed loops of bowel within the  right lower quadrant of the abdomen in keeping with a high-grade partial or developing complete small bowel obstruction. This is best seen on sagittal image # 47/4 and axial image # 41/2 with subsequent transition to decompressed loops of distal small bowel. Mild to moderate pancolonic diverticulosis, most severe within the descending colon. Appendix normal. No free intraperitoneal gas. Trace ascites. Vascular/Lymphatic: No significant vascular findings are present. No enlarged abdominal or pelvic lymph nodes. Reproductive: Uterus and bilateral adnexa are unremarkable. Other: No abdominal wall hernia Musculoskeletal: No acute bone abnormality. No lytic or blastic bone lesion. IMPRESSION: 1. High-grade partial or developing complete small bowel obstruction with gradual transition to decompressed loops of bowel within the right lower quadrant of the abdomen. Trace ascites 2. Mild to moderate pancolonic diverticulosis without superimposed acute inflammatory change. Electronically Signed   By: Fidela Salisbury M.D.   On: 12/01/2022 04:02    Medications Ordered in ED Medications  HYDROmorphone (DILAUDID) injection 0.5 mg (has no administration in time range)  sodium chloride 0.9 % bolus 1,000 mL (1,000 mLs Intravenous New Bag/Given 12/01/22 0248)  HYDROmorphone (DILAUDID) injection 0.5 mg (0.5 mg Intravenous Given 12/01/22 0250)  ondansetron (ZOFRAN) injection 4 mg (4 mg Intravenous Given 12/01/22 0249)  iohexol (OMNIPAQUE) 300 MG/ML solution 80 mL (80 mLs Intravenous Contrast Given 12/01/22 0314)                                                                                                                                     Procedures Procedures  (including critical care time)  Medical Decision Making / ED Course   Medical Decision Making Amount and/or Complexity of Data Reviewed Labs: ordered. Decision-making details documented in ED Course. Radiology: ordered and independent interpretation performed.  Decision-making details documented in ED Course.  Risk Prescription drug management.  Lower abdominal pain.  Similar to prior bowel obstructions. Differential includes SBO, other intra-abdominal inflammatory/infectious process such as diverticulitis, UTI, pyelonephritis.  Patient provided with IV fluids, IV pain medicine and antiemetics.  CBC without leukocytosis or anemia Metabolic panel without significant electrolyte derangements or renal sufficiency No evidence of bili obstruction or pancreatitis UA pending CT scan is consistent with partial to near complete SBO.   NG tube placed. Patient will require admission to the hospital service under SBO protocol.       Final Clinical Impression(s) / ED Diagnoses Final diagnoses:  None           This chart was dictated using voice recognition software.  Despite best efforts to  proofread,  errors can occur which can change the documentation meaning.    Fatima Blank, MD 12/01/22 [phone]

## 2022-12-02 ENCOUNTER — Other Ambulatory Visit (HOSPITAL_COMMUNITY): Payer: Self-pay

## 2022-12-13 ENCOUNTER — Other Ambulatory Visit (HOSPITAL_COMMUNITY): Payer: Self-pay

## 2023-02-05 IMAGING — CR DG CHEST 2V
2 series · 2 of 2 positions shown · non-contrast
Comparison: 12/07/2015

CLINICAL DATA: Short of breath, flu like symptoms

EXAM:
CHEST - 2 VIEW

[w chest pa]
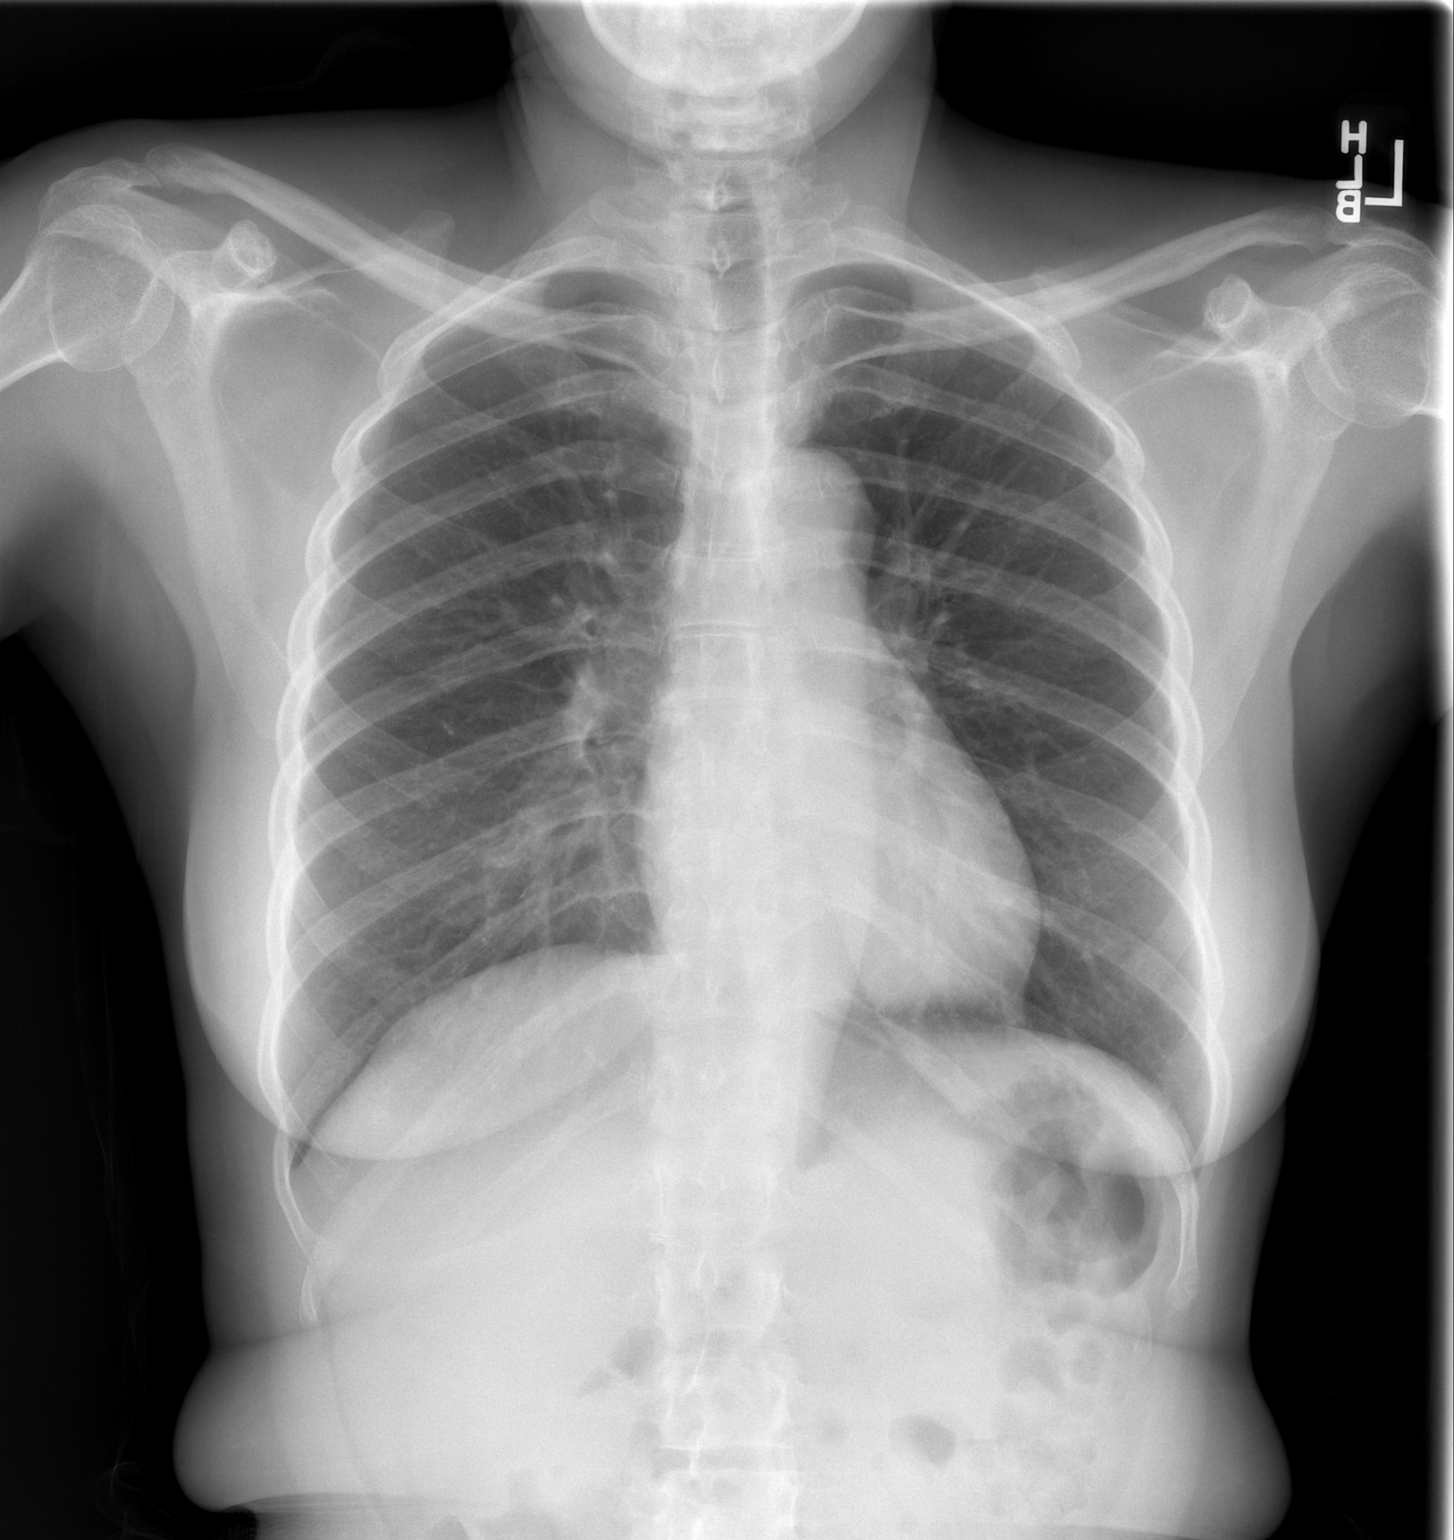

[w chest lat]
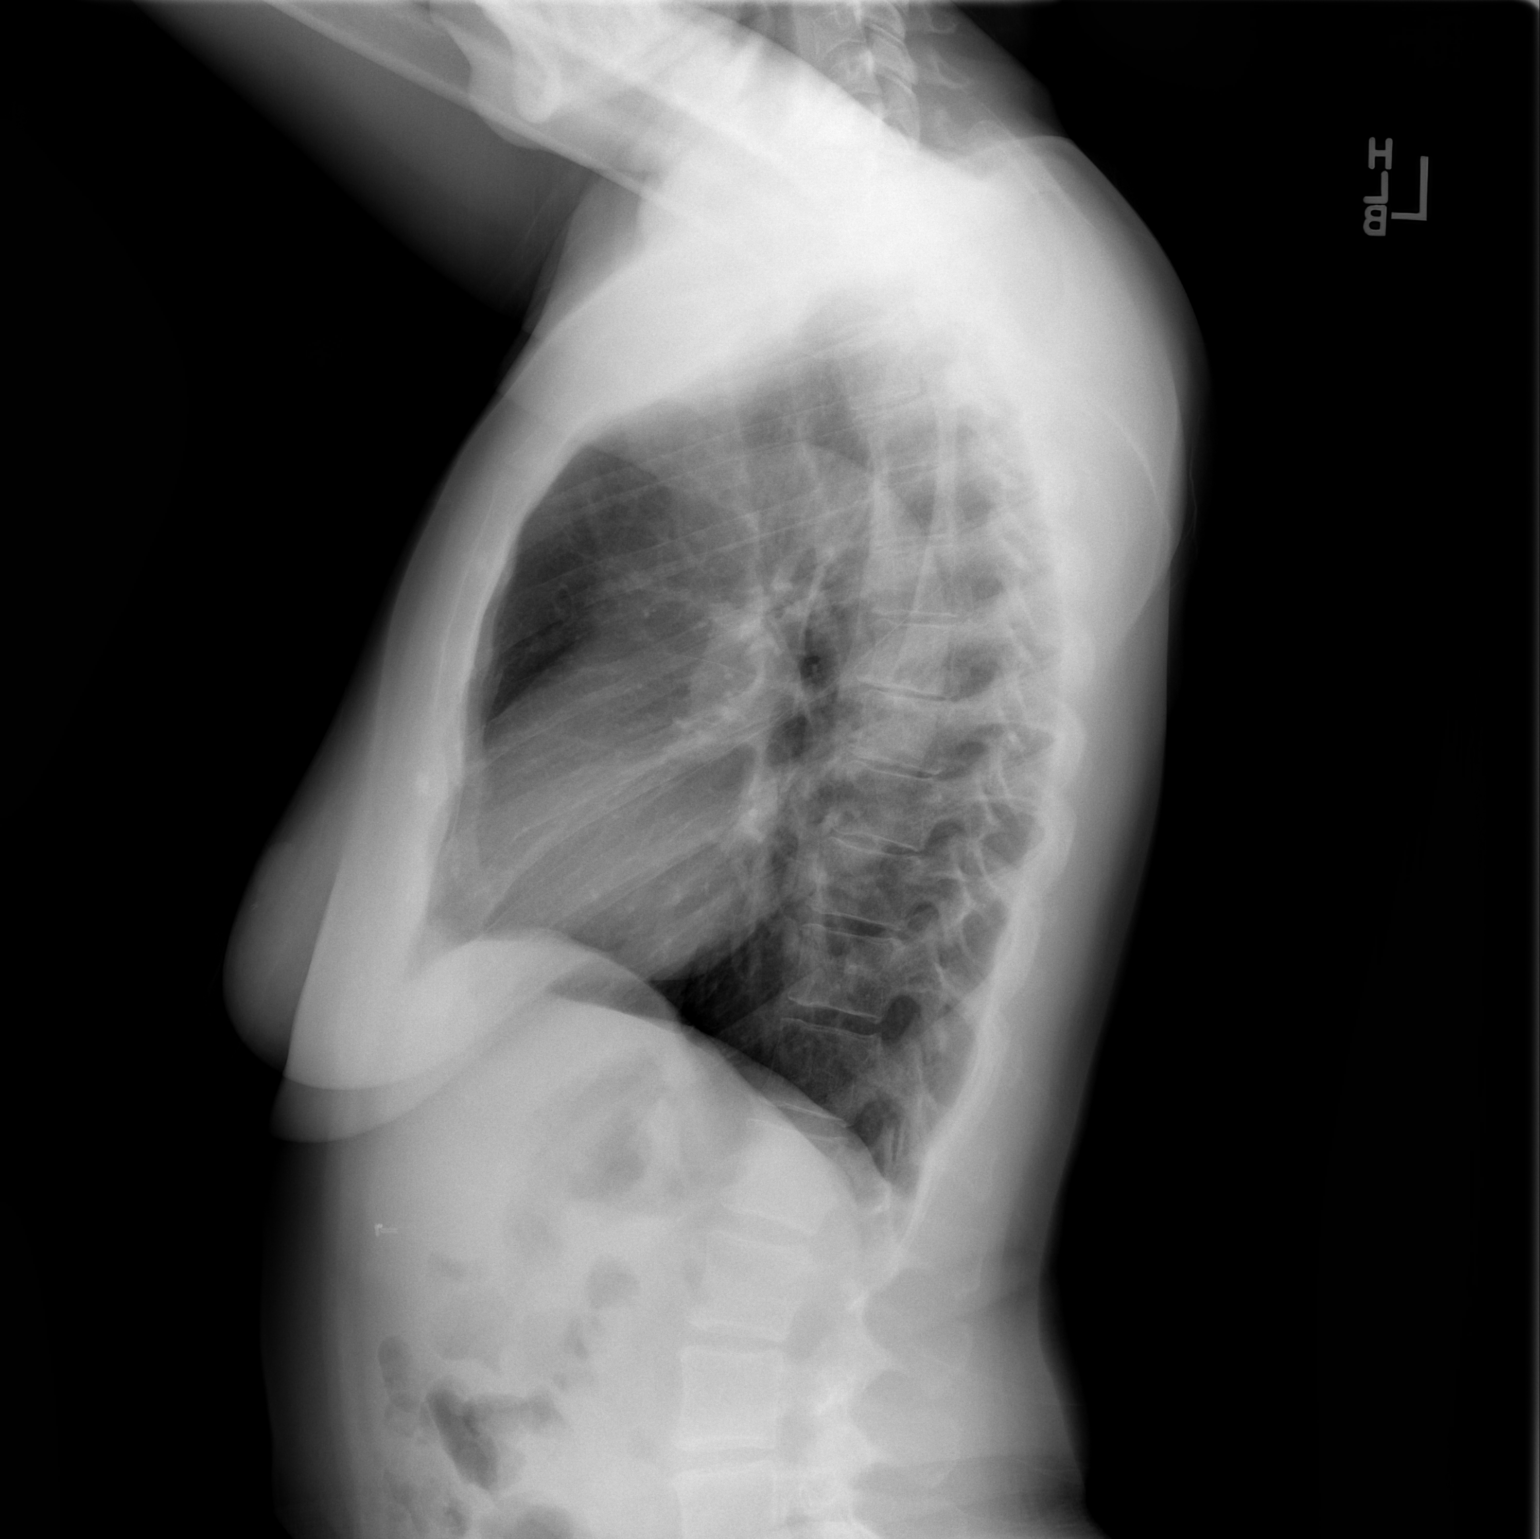

[2 of 2 positions shown; findings below may reference images not displayed]

FINDINGS: The heart size and mediastinal contours are within normal limits.
Both lungs are clear. The visualized skeletal structures are
unremarkable.
IMPRESSION: No active cardiopulmonary disease.

## 2023-05-27 ENCOUNTER — Emergency Department (HOSPITAL_BASED_OUTPATIENT_CLINIC_OR_DEPARTMENT_OTHER): Payer: No Typology Code available for payment source

## 2023-05-27 ENCOUNTER — Emergency Department (HOSPITAL_BASED_OUTPATIENT_CLINIC_OR_DEPARTMENT_OTHER)
Admission: EM | Admit: 2023-05-27 | Discharge: 2023-05-27 | Disposition: A | Discharge status: Home / Self Care | Payer: No Typology Code available for payment source | Attending: Emergency Medicine | Admitting: Emergency Medicine

## 2023-05-27 ENCOUNTER — Other Ambulatory Visit: Payer: Self-pay

## 2023-05-27 DIAGNOSIS — Z79899 Other long term (current) drug therapy: Secondary | ICD-10-CM | POA: Insufficient documentation

## 2023-05-27 DIAGNOSIS — R42 Dizziness and giddiness: Secondary | ICD-10-CM | POA: Insufficient documentation

## 2023-05-27 DIAGNOSIS — I10 Essential (primary) hypertension: Secondary | ICD-10-CM | POA: Diagnosis not present

## 2023-05-27 LAB — CBC WITH DIFFERENTIAL/PLATELET
Abs Immature Granulocytes: 0.01 10*3/uL (ref 0.00–0.07)
Basophils Absolute: 0 10*3/uL (ref 0.0–0.1)
Basophils Relative: 1 %
Eosinophils Absolute: 0 10*3/uL (ref 0.0–0.5)
Eosinophils Relative: 1 %
HCT: 38.7 % (ref 36.0–46.0)
Hemoglobin: 12.9 g/dL (ref 12.0–15.0)
Immature Granulocytes: 0 %
Lymphocytes Relative: 35 %
Lymphs Abs: 1.5 10*3/uL (ref 0.7–4.0)
MCH: 31.8 pg (ref 26.0–34.0)
MCHC: 33.3 g/dL (ref 30.0–36.0)
MCV: 95.3 fL (ref 80.0–100.0)
Monocytes Absolute: 0.6 10*3/uL (ref 0.1–1.0)
Monocytes Relative: 14 %
Neutro Abs: 2.1 10*3/uL (ref 1.7–7.7)
Neutrophils Relative %: 49 %
Platelets: 269 10*3/uL (ref 150–400)
RBC: 4.06 MIL/uL (ref 3.87–5.11)
RDW: 12.5 % (ref 11.5–15.5)
WBC: 4.3 10*3/uL (ref 4.0–10.5)
nRBC: 0 % (ref 0.0–0.2)

## 2023-05-27 LAB — COMPREHENSIVE METABOLIC PANEL
ALT: 19 U/L (ref 0–44)
AST: 25 U/L (ref 15–41)
Albumin: 3.5 g/dL (ref 3.5–5.0)
Alkaline Phosphatase: 60 U/L (ref 38–126)
Anion gap: 9 (ref 5–15)
BUN: 14 mg/dL (ref 6–20)
CO2: 26 mmol/L (ref 22–32)
Calcium: 8.9 mg/dL (ref 8.9–10.3)
Chloride: 102 mmol/L (ref 98–111)
Creatinine, Ser: 1.03 mg/dL — ABNORMAL HIGH (ref 0.44–1.00)
GFR, Estimated: >60 mL/min (ref >60)
Glucose, Bld: 93 mg/dL (ref 70–99)
Potassium: 3.7 mmol/L (ref 3.5–5.1)
Sodium: 137 mmol/L (ref 135–145)
Total Bilirubin: 0.6 mg/dL (ref 0.3–1.2)
Total Protein: 6.6 g/dL (ref 6.5–8.1)

## 2023-05-27 LAB — MAGNESIUM: Magnesium: 1.9 mg/dL (ref 1.7–2.4)

## 2023-05-27 LAB — CBG MONITORING, ED: Glucose-Capillary: 91 mg/dL (ref 70–99)

## 2023-05-27 MED ORDER — LACTATED RINGERS IV BOLUS
1000.0000 mL | Freq: Once | INTRAVENOUS | Status: AC
  Administered 2023-05-27: 1000 mL via INTRAVENOUS

## 2023-05-27 MED ORDER — MECLIZINE HCL 25 MG PO TABS: 25.0000 mg | ORAL_TABLET | Freq: Three times a day (TID) | ORAL | 0 refills | Status: DC | PRN

## 2023-05-27 MED ORDER — MECLIZINE HCL 25 MG PO TABS
25.0000 mg | ORAL_TABLET | Freq: Once | ORAL | Status: AC
  Administered 2023-05-27: 25 mg via ORAL
  Filled 2023-05-27: qty 1

## 2023-05-27 MED ORDER — IOHEXOL 350 MG/ML SOLN
75.0000 mL | Freq: Once | INTRAVENOUS | Status: AC | PRN
  Administered 2023-05-27: 75 mL via INTRAVENOUS

## 2023-05-27 NOTE — Discharge Instructions (Signed)
You were seen in the ER today for evaluation of your dizziness.  I am glad you are feeling better.  Your imaging was unremarkable.  Your labs indicated some level of dehydration with an elevated kidney function.  We have given you some IV fluids here which should improve this.  This could be causing you to have this room spinning sensation.  I am also giving you some meclizine which you were given here today as a prescription.  You take as needed.  Please make sure you follow-up with your primary care doctor.  If you have any concerns with new or worsening symptoms, please return to the nearest emergency department for evaluation.  Contact a doctor if: Your medicine does not help your vertigo. Your problems get worse or you have new symptoms. You have a fever. You feel like you may vomit (nauseous), or this feeling gets worse. You start to vomit. Your family or friends see changes in how you act. You lose feeling (have numbness) in part of your body. You feel prickling and tingling in a part of your body. Get help right away if: You are always dizzy. You faint. You get very bad headaches. You get a stiff neck. Bright light starts to bother you. You have trouble moving or talking. You feel weak in your hands, arms, or legs. You have changes in your hearing or in how you see (vision). These symptoms may be an emergency. Get help right away. Call your local emergency services (911 in the [place] Do not wait to see if the symptoms will go away. Do not drive yourself to the hospital.

## 2023-05-27 NOTE — ED Notes (Signed)
Attempted x 2 for IV access. EDP notified. Will have another nurse attempt

## 2023-05-27 NOTE — ED Notes (Signed)
ED Provider at bedside. 

## 2023-05-27 NOTE — ED Provider Notes (Signed)
Ultrasound ED Peripheral IV (Provider)  Date/Time: 05/27/2023 2:31 PM  Performed by: Sloan Leiter, DO Authorized by: Sloan Leiter, DO   Procedure details:    Indications: multiple failed IV attempts and poor IV access     Skin Prep: chlorhexidine gluconate     Location:  Left AC   Angiocath:  20 G   Bedside Ultrasound Guided: Yes     Images: not archived     Patient tolerated procedure without complications: Yes     Dressing applied: Yes       Sloan Leiter, DO 05/27/23 1431

## 2023-05-27 NOTE — ED Notes (Signed)
Patient transported to CT 

## 2023-05-27 NOTE — ED Provider Notes (Signed)
[place] [facility] Provider Note   CSN: [id number] Arrival date & time: 05/27/23  1052     History Chief Complaint  Patient presents with   Dizziness    Marilyn Burgess is a 52 y.o. female with history of vertigo and hypertension presents emerged from today for evaluation of room spinning sensation upon standing up for the past 4 days.  Patient reports that she has had vertigo before in the past years and this feels very similar to this.  Again, she also mentions that this feels similar to when her potassium is low 2.  She reports that her potassium has been low before due to her blood pressure medications.  She describes it as more as a room spinning sensation only happens when going from a lying to standing or sitting to standing position.  It last for just a few minutes.  She denies any headache or visual changes.  Denies any fevers or chills.  Denies any chest pain or shortness of breath.  She reports that she feels better after few minutes and is able to walk and talk.  No medications trialed.  She denies any unilateral weakness.  Denies any syncope.  No known drug allergies.  Denies any tobacco, EtOH, licit drug use.   Dizziness Associated symptoms: no chest pain, no headaches, no nausea, no palpitations, no shortness of breath, no vomiting and no weakness        Home Medications Prior to Admission medications   Medication Sig Start Date End Date Taking? Authorizing Provider  meclizine (ANTIVERT) 25 MG tablet Take 1 tablet (25 mg total) by mouth 3 (three) times daily as needed for dizziness. 05/27/23  Yes Achille Rich, PA-C  acetaminophen (TYLENOL) 500 MG tablet Take 1,000 mg by mouth as needed for moderate pain.    [provider]  ALPRAZolam Prudy Feeler) 0.5 MG tablet Take 0.5 mg by mouth as needed for anxiety.    [provider]  BIOTIN PO Take 2 capsules by mouth daily.    [provider]  Calcium Carbonate Antacid  (TUMS PO) Take 2 tablets by mouth as needed (heartburn).    [provider]  clindamycin (CLINDAGEL) 1 % gel Apply 1 application. topically as needed (rash).    [provider]  Fluocin-Hydroquinone-Tretinoin (TRI-LUMA EX) Apply 1 application  topically daily.    [provider]  lisinopril-hydrochlorothiazide (ZESTORETIC) 20-12.5 MG tablet Take 1 tablet by mouth daily. 01/10/22   [provider]  metoprolol (LOPRESSOR) 50 MG tablet Take 25 mg by mouth daily. 05/27/16   [provider]  Multiple Vitamin (MULTIVITAMIN PO) Take 2 tablets by mouth daily.    [provider]  norethindrone (MICRONOR,CAMILA,ERRIN) 0.35 MG tablet Take 1 tablet by mouth at bedtime.    [provider]  potassium chloride (KLOR-CON M) 10 MEQ tablet Take 1 tablet (10 mEq total) by mouth 2 (two) times daily. 12/01/22   Danford, Earl Lites, MD  tretinoin (RETIN-A) 0.025 % cream Apply 1 Application topically daily. 11/24/22   [provider]  valACYclovir (VALTREX) 1000 MG tablet Take 1,000 mg by mouth daily.    [provider]      Allergies    Patient has no known allergies.    Review of Systems   Review of Systems  Constitutional:  Negative for chills and fever.  HENT:  Negative for congestion and rhinorrhea.   Respiratory:  Negative for cough and shortness of breath.   Cardiovascular:  Negative for chest pain and palpitations.  Gastrointestinal:  Negative for abdominal pain, nausea and vomiting.  Genitourinary:  Negative for dysuria and hematuria.  Musculoskeletal:  Negative for gait problem.  Neurological:  Positive for dizziness. Negative for syncope, speech difficulty, weakness and headaches.    Physical Exam Updated Vital Signs BP (!) 135/92   Pulse 69   Temp 98.3 F (36.8 C) (Oral)   Resp 18   Ht 4\' 8"  (1.422 m)   Wt 53.1 kg   SpO2 100%   BMI 26.23 kg/m  Physical Exam Vitals and nursing note reviewed.  Constitutional:       General: She is not in acute distress.    Appearance: Normal appearance. She is not ill-appearing or toxic-appearing.  HENT:     Right Ear: Tympanic membrane, ear canal and external ear normal.     Left Ear: Tympanic membrane, ear canal and external ear normal.     Mouth/Throat:     Mouth: Mucous membranes are moist.  Eyes:     General: No scleral icterus.    Extraocular Movements: Extraocular movements intact.     Pupils: Pupils are equal, round, and reactive to light.  Cardiovascular:     Rate and Rhythm: Normal rate and regular rhythm.  Pulmonary:     Effort: Pulmonary effort is normal. No respiratory distress.  Musculoskeletal:        General: No deformity.     Cervical back: Normal range of motion. No rigidity.     Right lower leg: No edema.     Left lower leg: No edema.  Skin:    General: Skin is warm and dry.  Neurological:     General: No focal deficit present.     Mental Status: She is alert. Mental status is at baseline.     GCS: GCS eye subscore is 4. GCS verbal subscore is 5. GCS motor subscore is 6.     Cranial Nerves: No cranial nerve deficit, dysarthria or facial asymmetry.     Sensory: No sensory deficit.     Motor: No weakness or pronator drift.     Coordination: Finger-Nose-Finger Test normal.     Gait: Gait normal.     ED Results / Procedures / Treatments   Labs (all labs ordered are listed, but only abnormal results are displayed) Labs Reviewed  COMPREHENSIVE METABOLIC PANEL - Abnormal; Notable for the following components:      Result Value   Creatinine, Ser 1.03 (*)    All other components within normal limits  CBC WITH DIFFERENTIAL/PLATELET  MAGNESIUM  CBG MONITORING, ED    EKG EKG Interpretation Date/Time:  Saturday May 27 2023 11:00:36 EDT Ventricular Rate:  66 PR Interval:  171 QRS Duration:  77 QT Interval:  389 QTC Calculation: 408 R Axis:   74  Text Interpretation: Sinus rhythm Borderline T abnormalities, anterior leads no  stemi twi v3 similar to prior Confirmed by Tanda Rockers ([id number]) on 05/27/2023 11:58:59 AM  Radiology CT ANGIO HEAD NECK W WO CM  Result Date: 05/27/2023 CLINICAL DATA:  Dizziness for a few days. EXAM: CT ANGIOGRAPHY HEAD AND NECK WITH AND WITHOUT CONTRAST TECHNIQUE: Multidetector CT imaging of the head and neck was performed using the standard protocol during bolus administration of intravenous contrast. Multiplanar CT image reconstructions and MIPs were obtained to evaluate the vascular anatomy. Carotid stenosis measurements (when applicable) are obtained utilizing NASCET criteria, using the distal internal carotid diameter as the denominator. RADIATION DOSE REDUCTION: This exam was  performed according to the departmental dose-optimization program which includes automated exposure control, adjustment of the mA and/or kV according to patient size and/or use of iterative reconstruction technique. CONTRAST:  75mL OMNIPAQUE IOHEXOL 350 MG/ML SOLN COMPARISON:  None Available. FINDINGS: CT HEAD FINDINGS Brain: There is no acute intracranial hemorrhage, extra-axial fluid collection, or acute infarct Parenchymal volume is normal. The ventricles are normal in size. Gray-white differentiation is preserved. The pituitary and suprasellar region are normal. There is no mass lesion. There is no mass effect or midline shift. Vascular: See below. Skull: Normal. Negative for fracture or focal lesion. Sinuses/Orbits: The paranasal sinuses are clear. The globes and orbits are unremarkable. Other: The mastoid air cells and middle ear cavities are clear. Review of the MIP images confirms the above findings CTA NECK FINDINGS Aortic arch: The imaged aortic arch is normal. The origins of the major branch vessels are patent. The subclavian arteries are patent to the level imaged. Right carotid system: The right common, internal, and external carotid arteries are patent, without hemodynamically significant stenosis or occlusion. There is no  evidence of dissection or aneurysm. Left carotid system: Left common, internal, and external carotid arteries are patent, without hemodynamically significant stenosis or occlusion. There is no evidence of dissection or aneurysm. Vertebral arteries: The vertebral arteries are patent, without hemodynamically significant stenosis or occlusion. There is no evidence of dissection or aneurysm. Skeleton: There is no acute osseous abnormality or suspicious osseous lesion. There is no visible canal hematoma. Other neck: The soft tissues of the neck are unremarkable. Upper chest: The imaged lung apices are clear. Review of the MIP images confirms the above findings CTA HEAD FINDINGS Anterior circulation: The intracranial ICAs are normal. The bilateral MCAs and ACAS are normal, without proximal stenosis or occlusion. The anterior communicating artery is normal. Posterior circulation: The bilateral V4 segments are normal. The basilar artery is normal. The major cerebellar arteries appear normal. The bilateral PCAs are normal, without proximal stenosis or occlusion. Posterior communicating arteries are not definitely seen. There is no aneurysm or AVM. Venous sinuses: Patent. Anatomic variants: None. Review of the MIP images confirms the above findings IMPRESSION: 1. Normal noncontrast head CT with no acute intracranial pathology. 2. Normal CTA of the head and neck. Electronically Signed   By: Lesia Hausen M.D.   On: 05/27/2023 16:08    Procedures Procedures   Medications Ordered in ED Medications  meclizine (ANTIVERT) tablet 25 mg (25 mg Oral Given 05/27/23 1319)  lactated ringers bolus 1,000 mL (0 mLs Intravenous Stopped 05/27/23 1601)  iohexol (OMNIPAQUE) 350 MG/ML injection 75 mL (75 mLs Intravenous Contrast Given 05/27/23 1448)    ED Course/ Medical Decision Making/ A&P    Medical Decision Making Amount and/or Complexity of Data Reviewed Labs: ordered. Radiology: ordered.  Risk Prescription drug  management.   52 y.o. female presents to the ER for evaluation of  dizziness. Differential diagnosis includes but is not limited to BPPV, vestibular migraine, head trauma, AVM, intracranial tumor, multiple sclerosis, drug-related, CVA, vasovagal syncope, orthostatic hypotension, sepsis, hypoglycemia, electrolyte disturbance, anemia, anxiety/panic attack. Vital signs BP 135/92 otherwise unremarkable. Physical exam as noted above.   IV fluids ordered.  I independently reviewed and interpreted the patient's labs.  CMP does show creatinine at 1.03 elevated from baseline around 0.90.  No other electrolyte or LFT abnormality.  CBG within normal limits.  CBC without leukocytosis or anemia.  Magnesium within normal limits.  Discussed this case with my attending will order CT angio head and  neck.  CT shows : 1. Normal noncontrast head CT with no acute intracranial pathology. 2. Normal CTA of the head and neck.   The patient was given fluids and meclizine.  She reports that she is feeling better and is not having any of the symptoms upon standing.  Could be some dehydration or her vertigo returning.  Her potassium is in within normal limits.  I do not think she needs any MRI as she does not have any focal neurodeficit.  Will send her home with some meclizine as well for symptoms.  Discussed with her about maintaining well hydration and drinking plenty of fluids, mainly water.  Recommended follow-up with primary care doctor and the ear nose and throat specialist given she saw them previously for her vertigo.  We discussed the results of the labs/imaging. The plan is take meclizine as prescribed, drink plenty of fluids, follow-up with PCP/ENT. We discussed strict return precautions and red flag symptoms. The patient verbalized their understanding and agrees to the plan. The patient is stable and being discharged home in good condition.  Portions of this report may have been transcribed using voice recognition  software. Every effort was made to ensure accuracy; however, inadvertent computerized transcription errors may be present.   I discussed this case with my attending physician who cosigned this note including patient's presenting symptoms, physical exam, and planned diagnostics and interventions. Attending physician stated agreement with plan or made changes to plan which were implemented.   Final Clinical Impression(s) / ED Diagnoses Final diagnoses:  Vertigo    Rx / DC Orders ED Discharge Orders          Ordered    meclizine (ANTIVERT) 25 MG tablet  3 times daily PRN        05/27/23 1656              Achille Rich, PA-C 05/27/23 2041    Sloan Leiter, DO 06/01/23 [phone]

## 2023-05-27 NOTE — ED Triage Notes (Signed)
Pt reports intermittent dizziness x a few days; sts this has happened when her K+ was low before; denies other sxs

## 2023-10-30 ENCOUNTER — Other Ambulatory Visit: Payer: Self-pay

## 2023-10-30 ENCOUNTER — Emergency Department (HOSPITAL_BASED_OUTPATIENT_CLINIC_OR_DEPARTMENT_OTHER): Payer: No Typology Code available for payment source

## 2023-10-30 ENCOUNTER — Encounter (HOSPITAL_BASED_OUTPATIENT_CLINIC_OR_DEPARTMENT_OTHER): Payer: Self-pay

## 2023-10-30 ENCOUNTER — Emergency Department (HOSPITAL_BASED_OUTPATIENT_CLINIC_OR_DEPARTMENT_OTHER)
Admission: EM | Admit: 2023-10-30 | Discharge: 2023-10-30 | Disposition: A | Discharge status: Home / Self Care | Payer: No Typology Code available for payment source | Attending: Emergency Medicine | Admitting: Emergency Medicine

## 2023-10-30 DIAGNOSIS — I10 Essential (primary) hypertension: Secondary | ICD-10-CM | POA: Insufficient documentation

## 2023-10-30 DIAGNOSIS — R109 Unspecified abdominal pain: Secondary | ICD-10-CM | POA: Diagnosis present

## 2023-10-30 DIAGNOSIS — K56609 Unspecified intestinal obstruction, unspecified as to partial versus complete obstruction: Secondary | ICD-10-CM | POA: Diagnosis present

## 2023-10-30 DIAGNOSIS — Z8719 Personal history of other diseases of the digestive system: Secondary | ICD-10-CM

## 2023-10-30 DIAGNOSIS — T189XXA Foreign body of alimentary tract, part unspecified, initial encounter: Secondary | ICD-10-CM | POA: Insufficient documentation

## 2023-10-30 DIAGNOSIS — R1084 Generalized abdominal pain: Secondary | ICD-10-CM | POA: Diagnosis not present

## 2023-10-30 DIAGNOSIS — Z79899 Other long term (current) drug therapy: Secondary | ICD-10-CM | POA: Insufficient documentation

## 2023-10-30 DIAGNOSIS — Z87828 Personal history of other (healed) physical injury and trauma: Secondary | ICD-10-CM

## 2023-10-30 LAB — COMPREHENSIVE METABOLIC PANEL
ALT: 19 U/L (ref 0–44)
AST: 23 U/L (ref 15–41)
Albumin: 4.2 g/dL (ref 3.5–5.0)
Alkaline Phosphatase: 72 U/L (ref 38–126)
Anion gap: 10 (ref 5–15)
BUN: 12 mg/dL (ref 6–20)
CO2: 28 mmol/L (ref 22–32)
Calcium: 9.4 mg/dL (ref 8.9–10.3)
Chloride: 102 mmol/L (ref 98–111)
Creatinine, Ser: 0.96 mg/dL (ref 0.44–1.00)
GFR, Estimated: >60 mL/min (ref >60)
Glucose, Bld: 87 mg/dL (ref 70–99)
Potassium: 3.3 mmol/L — ABNORMAL LOW (ref 3.5–5.1)
Sodium: 140 mmol/L (ref 135–145)
Total Bilirubin: 0.6 mg/dL (ref <1.2)
Total Protein: 7.6 g/dL (ref 6.5–8.1)

## 2023-10-30 LAB — CBC
HCT: 40.1 % (ref 36.0–46.0)
Hemoglobin: 13.5 g/dL (ref 12.0–15.0)
MCH: 31 pg (ref 26.0–34.0)
MCHC: 33.7 g/dL (ref 30.0–36.0)
MCV: 92 fL (ref 80.0–100.0)
Platelets: 286 10*3/uL (ref 150–400)
RBC: 4.36 MIL/uL (ref 3.87–5.11)
RDW: 12.6 % (ref 11.5–15.5)
WBC: 4.8 10*3/uL (ref 4.0–10.5)
nRBC: 0 % (ref 0.0–0.2)

## 2023-10-30 LAB — URINALYSIS, ROUTINE W REFLEX MICROSCOPIC
Bilirubin Urine: NEGATIVE
Glucose, UA: NEGATIVE mg/dL
Hgb urine dipstick: NEGATIVE
Ketones, ur: NEGATIVE mg/dL
Leukocytes,Ua: NEGATIVE
Nitrite: NEGATIVE
Protein, ur: NEGATIVE mg/dL
Specific Gravity, Urine: 1.01 (ref 1.005–1.030)
pH: 7 (ref 5.0–8.0)

## 2023-10-30 LAB — LIPASE, BLOOD: Lipase: 33 U/L (ref 11–51)

## 2023-10-30 LAB — PREGNANCY, URINE: Preg Test, Ur: NEGATIVE

## 2023-10-30 MED ORDER — MORPHINE SULFATE (PF) 4 MG/ML IV SOLN
4.0000 mg | Freq: Once | INTRAVENOUS | Status: AC
  Administered 2023-10-30: 4 mg via INTRAVENOUS
  Filled 2023-10-30: qty 1

## 2023-10-30 MED ORDER — IOHEXOL 300 MG/ML  SOLN
100.0000 mL | Freq: Once | INTRAMUSCULAR | Status: AC | PRN
  Administered 2023-10-30: 100 mL via INTRAVENOUS

## 2023-10-30 MED ORDER — POTASSIUM CHLORIDE CRYS ER 20 MEQ PO TBCR
40.0000 meq | EXTENDED_RELEASE_TABLET | Freq: Once | ORAL | Status: AC
  Administered 2023-10-30: 40 meq via ORAL
  Filled 2023-10-30: qty 2

## 2023-10-30 NOTE — Discharge Instructions (Signed)
Your lab work today was notable for slightly low potassium.  You should have this rechecked by your doctor.  You also had a possible bowel obstruction on your CT scan.  We discussed this, and your symptoms were improving and you wanted to go home.  You should drink plenty of fluids and keep track of your symptoms.  If you develop any vomiting, worsening abdominal pain, difficulty having bowel movements or passing gas you need to return to the ED.

## 2023-10-30 NOTE — ED Notes (Signed)
Water and gram crackers provided to patient for PO challenge.

## 2023-10-30 NOTE — ED Triage Notes (Signed)
C/o abdominal pain, hx of bowel obstruction. States was eating her lunch and found some plastic. States started having pain after eating, nausea.

## 2023-10-30 NOTE — ED Provider Notes (Signed)
[place] [facility] Provider Note   CSN: [id number] Arrival date & time: 10/30/23  1709     History {Add pertinent medical, surgical, social history, OB history to HPI:1} Chief Complaint  Patient presents with   Abdominal Pain    Marilyn Burgess is a 52 y.o. female.   Abdominal Pain 52 year old female history of anxiety, hypertension, prior small bowel obstruction presenting for abdominal pain.  Patient states for a while she thought she felt some plastic in her feet.  Is a small 2 mm piece of clear plastic.  Shortly after that she developed some abdominal pain, nausea.  No vomiting.  She initially was not passing gas.  Show Small normal and she got here.  No blood.  She feels like it is similar to her prior obstructions.  No chest pain or shortness of breath.  No fevers or chills.  No urinary symptoms.     Home Medications Prior to Admission medications   Medication Sig Start Date End Date Taking? Authorizing Provider  acetaminophen (TYLENOL) 500 MG tablet Take 1,000 mg by mouth as needed for moderate pain.    [provider]  ALPRAZolam Prudy Feeler) 0.5 MG tablet Take 0.5 mg by mouth as needed for anxiety.    [provider]  BIOTIN PO Take 2 capsules by mouth daily.    [provider]  Calcium Carbonate Antacid (TUMS PO) Take 2 tablets by mouth as needed (heartburn).    [provider]  clindamycin (CLINDAGEL) 1 % gel Apply 1 application. topically as needed (rash).    [provider]  Fluocin-Hydroquinone-Tretinoin (TRI-LUMA EX) Apply 1 application  topically daily.    [provider]  lisinopril-hydrochlorothiazide (ZESTORETIC) 20-12.5 MG tablet Take 1 tablet by mouth daily. 01/10/22   [provider]  meclizine (ANTIVERT) 25 MG tablet Take 1 tablet (25 mg total) by mouth 3 (three) times daily as needed for dizziness. 05/27/23   Marilyn Rich, PA-C  metoprolol (LOPRESSOR) 50 MG tablet  Take 25 mg by mouth daily. 05/27/16   [provider]  Multiple Vitamin (MULTIVITAMIN PO) Take 2 tablets by mouth daily.    [provider]  norethindrone (MICRONOR,CAMILA,ERRIN) 0.35 MG tablet Take 1 tablet by mouth at bedtime.    [provider]  potassium chloride (KLOR-CON M) 10 MEQ tablet Take 1 tablet (10 mEq total) by mouth 2 (two) times daily. 12/01/22   Danford, Earl Lites, MD  tretinoin (RETIN-A) 0.025 % cream Apply 1 Application topically daily. 11/24/22   [provider]  valACYclovir (VALTREX) 1000 MG tablet Take 1,000 mg by mouth daily.    [provider]      Allergies    Patient has no known allergies.    Review of Systems   Review of Systems  Gastrointestinal:  Positive for abdominal pain.  Review of systems completed and notable as per HPI.  ROS otherwise negative.   Physical Exam Updated Vital Signs BP (!) 168/97   Pulse 64   Temp 98.3 F (36.8 C) (Oral)   Resp 15   Ht 4\' 9"  (1.448 m)   Wt 52.2 kg   SpO2 100%   BMI 24.89 kg/m  Physical Exam Vitals and nursing note reviewed.  Constitutional:      General: She is not in acute distress.    Appearance: She is well-developed.  HENT:     Head: Normocephalic and atraumatic.  Eyes:     Conjunctiva/sclera: Conjunctivae normal.  Cardiovascular:  Rate and Rhythm: Normal rate and regular rhythm.     Heart sounds: No murmur heard. Pulmonary:     Effort: Pulmonary effort is normal. No respiratory distress.     Breath sounds: Normal breath sounds.  Abdominal:     Palpations: Abdomen is soft.     Tenderness: There is generalized abdominal tenderness. There is no right CVA tenderness, left CVA tenderness, guarding or rebound.  Musculoskeletal:        General: No swelling.     Cervical back: Neck supple.  Skin:    General: Skin is warm and dry.     Capillary Refill: Capillary refill takes less than 2 seconds.  Neurological:     Mental Status: She is alert.   Psychiatric:        Mood and Affect: Mood normal.     ED Results / Procedures / Treatments   Labs (all labs ordered are listed, but only abnormal results are displayed) Labs Reviewed  COMPREHENSIVE METABOLIC PANEL - Abnormal; Notable for the following components:      Result Value   Potassium 3.3 (*)    All other components within normal limits  LIPASE, BLOOD  CBC  URINALYSIS, ROUTINE W REFLEX MICROSCOPIC  PREGNANCY, URINE    EKG None  Radiology No results found.  Procedures Procedures  {Document cardiac monitor, telemetry assessment procedure when appropriate:1}  Medications Ordered in ED Medications - No data to display  ED Course/ Medical Decision Making/ A&P   {   Click here for ABCD2, HEART and other calculatorsREFRESH Note before signing :1}                              Medical Decision Making Amount and/or Complexity of Data Reviewed Labs: ordered. Radiology: ordered.  Risk Prescription drug management.   Medical Decision Making:   [name] is a 52 y.o. female who presented to the ED today with abdominal pain.  Vitals normal for mild hypertension.  Exam she is well-appearing.  No generalized tenderness.  She reports chronic use of plastic in her food.  Appears quite small, I suspect this is incidental.  She does have history of prior bowel obstruction obtain CT scan for evaluation.   {crccomplexity:27900} Reviewed and confirmed nursing documentation for past medical history, family history, social history.  Reassessment and Plan:   CT scan with possible early or low-grade bowel obstruction.  Lab work is notable for mild hypokalemia.  Reevaluation pain has resolved.  She was able to pass some flatus in the bathroom.  Has not had additional bowel movement.  No nausea or vomiting.  Discussed with general surgery.   Patient's presentation is most consistent with {EM COPA:27473}     {Document critical care time when appropriate:1} {Document  review of labs and clinical decision tools ie heart score, Chads2Vasc2 etc:1}  {Document your independent review of radiology images, and any outside records:1} {Document your discussion with family members, caretakers, and with consultants:1} {Document social determinants of health affecting pt's care:1} {Document your decision making why or why not admission, treatments were needed:1} Final Clinical Impression(s) / ED Diagnoses Final diagnoses:  None    Rx / DC Orders ED Discharge Orders     None

## 2024-02-26 ENCOUNTER — Other Ambulatory Visit: Payer: Self-pay

## 2024-02-26 ENCOUNTER — Emergency Department (HOSPITAL_BASED_OUTPATIENT_CLINIC_OR_DEPARTMENT_OTHER)
Admission: EM | Admit: 2024-02-26 | Discharge: 2024-02-26 | Disposition: A | Discharge status: Home / Self Care | Attending: Emergency Medicine | Admitting: Emergency Medicine

## 2024-02-26 ENCOUNTER — Encounter (HOSPITAL_BASED_OUTPATIENT_CLINIC_OR_DEPARTMENT_OTHER): Payer: Self-pay | Admitting: Emergency Medicine

## 2024-02-26 DIAGNOSIS — R519 Headache, unspecified: Secondary | ICD-10-CM | POA: Diagnosis not present

## 2024-02-26 DIAGNOSIS — J029 Acute pharyngitis, unspecified: Secondary | ICD-10-CM | POA: Diagnosis not present

## 2024-02-26 DIAGNOSIS — R0981 Nasal congestion: Secondary | ICD-10-CM | POA: Diagnosis not present

## 2024-02-26 DIAGNOSIS — I10 Essential (primary) hypertension: Secondary | ICD-10-CM | POA: Diagnosis not present

## 2024-02-26 MED ORDER — FLUTICASONE PROPIONATE 50 MCG/ACT NA SUSP: 2.0000 | Freq: Every day | NASAL | 0 refills | Status: DC

## 2024-02-26 MED ORDER — BENZONATATE 100 MG PO CAPS: 100.0000 mg | ORAL_CAPSULE | Freq: Three times a day (TID) | ORAL | 0 refills | Status: DC | PRN

## 2024-02-26 NOTE — Discharge Instructions (Signed)
 Please start taking either Claritin or Zyrtec over-the-counter daily.  I have called in a prescription for Flonase and a cough suppressant.  Please follow-up with your primary care doctor.

## 2024-02-26 NOTE — ED Provider Notes (Signed)
 Emergency Department Provider Note   I have reviewed the triage vital signs and the nursing notes.   HISTORY  Chief Complaint Nasal Congestion   HPI Marilyn Burgess is a 53 y.o. female patient presents emergency department with nasal congestion sore throat, face pain.  Symptoms have been progressing over the past 4 days.  No fevers.  No severe headaches.  She notes a mild cough.  She has been taking Mucinex with no relief in symptoms.  No body aches.    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 STORY: Hospitalized at [facility] 05/2008 d/t severe anxiety attack`E1o3L`IMPRESSION: Continue alprazolam as needed for sleep.  She does not wish to use anything else at this time.  Father is very sick with hepatic carcinoma.  Daughter is pregnant   Asymptomatic varicose veins of unspecified lower extremity 04/09/2014   Formatting of this note might be different from the original. 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 IMPRESSION: The goal for blood pressure is less than 140/90.  `E1o3L`If you are checking your blood pressure at home, please record it and bring it to your next office visit. Following the Dietary Approaches to Stop Hypertension (DASH) diet (3 servings of fruit and vegetables daily, whole grains, low sodium, low-fat proteins) and exerci   Hypokalemia 10/06/2011   Formatting of this note might be different from the original. IMPRESSION: Potassium is 3.9 on recheck.  Continue the potassium chloride supplementation daily.   Left shoulder pain 06/23/2014   Lichen planus 05/29/2014   Lipoma of back 06/14/2017   Oral contraceptive pill surveillance 11/30/2018   Primary osteoarthritis involving multiple joints 02/24/2015   SBO (small bowel obstruction) (HCC) 05/31/2016   Situational anxiety 04/21/2020   Stress headaches 01/15/2019   Vaginosis 05/29/2014   Vertigo 05/29/2014   Formatting of this note might be different from the original. Possibly BPPV of uncertain laterality vs Meniere's.   Vitamin D deficiency 04/09/2014    Review of Systems  Constitutional: No fever/chills ENT: Positive sore throat and nasal congestion.  Cardiovascular: Denies chest pain. Respiratory: Denies shortness of breath. Positive cough.  Skin: Negative for rash. Neurological: Negative for headaches.  ____________________________________________   PHYSICAL EXAM:  VITAL SIGNS: ED Triage Vitals [02/26/24 1056]  Encounter Vitals Group     BP (!) 132/93     Pulse Rate 64     Resp 18  Temp 97.9 F (36.6 C)     Temp src      SpO2 99 %     Weight 119 lb (54 kg)   Constitutional: Alert and oriented. Well appearing and in no acute distress. Eyes: Conjunctivae are normal. Head: Atraumatic. Mild tenderness to the face over frontal and maxillary sinuses.  Nose: Mild congestion/rhinnorhea. Mouth/Throat: Mucous  membranes are moist.  Neck: No stridor.   Cardiovascular: Good peripheral circulation. Respiratory: Normal respiratory effort.  Gastrointestinal: No distention.  Musculoskeletal: No gross deformities of extremities. Neurologic:  Normal speech and language.  Skin:  Skin is warm, dry and intact. No rash noted.  ____________________________________________   PROCEDURES  Procedure(s) performed:   Procedures  None  ____________________________________________   INITIAL IMPRESSION / ASSESSMENT AND PLAN / ED COURSE  Pertinent labs & imaging results that were available during my care of the patient were reviewed by me and considered in my medical decision making (see chart for details).   This patient is Presenting for Evaluation of congestion, which does require a range of treatment options, and is a complaint that involves a moderate risk of morbidity and mortality.  The Differential Diagnoses include sinusitis, seasonal allergies, URI, etc.  Medical Decision Making: Summary:  The patient presents emergency department with nasal congestion.  Mild tenderness to palpation of the maxillary sinuses but overall clinical picture is not consistent with a sinus infection which would require antibiotics.  Patient has only had 4 days of symptoms.  Plan for Flonase, decongestant, seasonal allergy medication such as Claritin or Zyrtec over-the-counter.  Patient's presentation is most consistent with acute, uncomplicated illness.   Disposition: discharge  ____________________________________________  FINAL CLINICAL IMPRESSION(S) / ED DIAGNOSES  Final diagnoses:  Nasal congestion     NEW OUTPATIENT MEDICATIONS STARTED DURING THIS VISIT:  New Prescriptions   BENZONATATE (TESSALON) 100 MG CAPSULE    Take 1 capsule (100 mg total) by mouth 3 (three) times daily as needed for cough.   FLUTICASONE (FLONASE) 50 MCG/ACT NASAL SPRAY    Place 2 sprays into both nostrils daily for 7 days.     Note:  This document was prepared using Dragon voice recognition software and may include unintentional dictation errors.  Alona Bene, MD, [facility] Emergency Medicine    Samanyu Tinnell, Arlyss Repress, MD 02/26/24 1115

## 2024-02-26 NOTE — ED Triage Notes (Signed)
 Nasal congestion and facial pressure x 4 days , denies headache or fever . No body aches

## 2024-03-04 DIAGNOSIS — Z1231 Encounter for screening mammogram for malignant neoplasm of breast: Secondary | ICD-10-CM | POA: Diagnosis not present

## 2024-03-20 ENCOUNTER — Observation Stay (HOSPITAL_COMMUNITY)
Admission: EM | Admit: 2024-03-20 | Discharge: 2024-03-22 | Disposition: A | Discharge status: Home / Self Care | Attending: Internal Medicine | Admitting: Internal Medicine

## 2024-03-20 ENCOUNTER — Other Ambulatory Visit: Payer: Self-pay

## 2024-03-20 DIAGNOSIS — R10817 Generalized abdominal tenderness: Secondary | ICD-10-CM | POA: Diagnosis present

## 2024-03-20 DIAGNOSIS — E876 Hypokalemia: Secondary | ICD-10-CM | POA: Diagnosis not present

## 2024-03-20 DIAGNOSIS — I10 Essential (primary) hypertension: Secondary | ICD-10-CM | POA: Diagnosis not present

## 2024-03-20 DIAGNOSIS — F411 Generalized anxiety disorder: Secondary | ICD-10-CM | POA: Insufficient documentation

## 2024-03-20 DIAGNOSIS — K56609 Unspecified intestinal obstruction, unspecified as to partial versus complete obstruction: Secondary | ICD-10-CM | POA: Diagnosis present

## 2024-03-20 DIAGNOSIS — K56600 Partial intestinal obstruction, unspecified as to cause: Secondary | ICD-10-CM | POA: Diagnosis not present

## 2024-03-20 LAB — CBC
HCT: 41.9 % (ref 36.0–46.0)
Hemoglobin: 14 g/dL (ref 12.0–15.0)
MCH: 30.5 pg (ref 26.0–34.0)
MCHC: 33.4 g/dL (ref 30.0–36.0)
MCV: 91.3 fL (ref 80.0–100.0)
Platelets: 295 10*3/uL (ref 150–400)
RBC: 4.59 MIL/uL (ref 3.87–5.11)
RDW: 12.7 % (ref 11.5–15.5)
WBC: 7.4 10*3/uL (ref 4.0–10.5)
nRBC: 0 % (ref 0.0–0.2)

## 2024-03-20 LAB — COMPREHENSIVE METABOLIC PANEL WITH GFR
ALT: 20 U/L (ref 0–44)
AST: 26 U/L (ref 15–41)
Albumin: 4.1 g/dL (ref 3.5–5.0)
Alkaline Phosphatase: 80 U/L (ref 38–126)
Anion gap: 11 (ref 5–15)
BUN: 14 mg/dL (ref 6–20)
CO2: 26 mmol/L (ref 22–32)
Calcium: 10 mg/dL (ref 8.9–10.3)
Chloride: 101 mmol/L (ref 98–111)
Creatinine, Ser: 0.78 mg/dL (ref 0.44–1.00)
GFR, Estimated: >60 mL/min (ref >60)
Glucose, Bld: 152 mg/dL — ABNORMAL HIGH (ref 70–99)
Potassium: 3.3 mmol/L — ABNORMAL LOW (ref 3.5–5.1)
Sodium: 138 mmol/L (ref 135–145)
Total Bilirubin: 1.1 mg/dL (ref 0.0–1.2)
Total Protein: 8 g/dL (ref 6.5–8.1)

## 2024-03-20 LAB — HCG, SERUM, QUALITATIVE: Preg, Serum: NEGATIVE

## 2024-03-20 LAB — LIPASE, BLOOD: Lipase: 31 U/L (ref 11–51)

## 2024-03-20 MED ORDER — ONDANSETRON HCL 4 MG/2ML IJ SOLN
4.0000 mg | Freq: Once | INTRAMUSCULAR | Status: AC
  Administered 2024-03-20: 4 mg via INTRAVENOUS
  Filled 2024-03-20: qty 2

## 2024-03-20 NOTE — ED Triage Notes (Signed)
 Patient c/o abdominal pain x 1 day. Patient report worsening abdominal pain tonight. Patient report N/V. X 1 tonight.

## 2024-03-20 NOTE — ED Provider Triage Note (Signed)
 Emergency Medicine Provider Triage Evaluation Note  Marilyn Burgess , a 53 y.o. female  was evaluated in triage.  Pt complains of abd pain. Diffused abd pain with nausea that started earlier today.  No fever, vomit, diarrhea or constipation.  Hx of SBO  Review of Systems  Positive: As above Negative: As above  Physical Exam  BP (!) 143/87 (BP Location: Left Arm)   Pulse 73   Temp 98.5 F (36.9 C) (Oral)   Resp 20   Ht 4\' 9"  (1.448 m)   Wt 54 kg   SpO2 100%   BMI 25.76 kg/m  Gen:   Awake, no distress   Resp:  Normal effort  MSK:   Moves extremities without difficulty  Other:    Medical Decision Making  Medically screening exam initiated at 10:00 PM.  Appropriate orders placed.  Sephanie Rodman was informed that the remainder of the evaluation will be completed by another provider, this initial triage assessment does not replace that evaluation, and the importance of remaining in the ED until their evaluation is complete.     Debbra Fairy, PA-C 03/20/24 2201

## 2024-03-21 ENCOUNTER — Observation Stay (HOSPITAL_COMMUNITY)

## 2024-03-21 ENCOUNTER — Emergency Department (HOSPITAL_COMMUNITY)

## 2024-03-21 ENCOUNTER — Encounter (HOSPITAL_COMMUNITY): Payer: Self-pay | Admitting: Internal Medicine

## 2024-03-21 DIAGNOSIS — K56609 Unspecified intestinal obstruction, unspecified as to partial versus complete obstruction: Secondary | ICD-10-CM

## 2024-03-21 DIAGNOSIS — K56600 Partial intestinal obstruction, unspecified as to cause: Principal | ICD-10-CM | POA: Diagnosis present

## 2024-03-21 LAB — COMPREHENSIVE METABOLIC PANEL WITH GFR
ALT: 15 U/L (ref 0–44)
AST: 17 U/L (ref 15–41)
Albumin: 3.4 g/dL — ABNORMAL LOW (ref 3.5–5.0)
Alkaline Phosphatase: 65 U/L (ref 38–126)
Anion gap: 8 (ref 5–15)
BUN: 11 mg/dL (ref 6–20)
CO2: 23 mmol/L (ref 22–32)
Calcium: 8.5 mg/dL — ABNORMAL LOW (ref 8.9–10.3)
Chloride: 105 mmol/L (ref 98–111)
Creatinine, Ser: 0.87 mg/dL (ref 0.44–1.00)
GFR, Estimated: >60 mL/min (ref >60)
Glucose, Bld: 105 mg/dL — ABNORMAL HIGH (ref 70–99)
Potassium: 3.7 mmol/L (ref 3.5–5.1)
Sodium: 136 mmol/L (ref 135–145)
Total Bilirubin: 0.8 mg/dL (ref 0.0–1.2)
Total Protein: 6.1 g/dL — ABNORMAL LOW (ref 6.5–8.1)

## 2024-03-21 LAB — CBC WITH DIFFERENTIAL/PLATELET
Abs Immature Granulocytes: 0.01 10*3/uL (ref 0.00–0.07)
Basophils Absolute: 0 10*3/uL (ref 0.0–0.1)
Basophils Relative: 0 %
Eosinophils Absolute: 0 10*3/uL (ref 0.0–0.5)
Eosinophils Relative: 0 %
HCT: 37.1 % (ref 36.0–46.0)
Hemoglobin: 11.9 g/dL — ABNORMAL LOW (ref 12.0–15.0)
Immature Granulocytes: 0 %
Lymphocytes Relative: 20 %
Lymphs Abs: 1.4 10*3/uL (ref 0.7–4.0)
MCH: 30.6 pg (ref 26.0–34.0)
MCHC: 32.1 g/dL (ref 30.0–36.0)
MCV: 95.4 fL (ref 80.0–100.0)
Monocytes Absolute: 0.4 10*3/uL (ref 0.1–1.0)
Monocytes Relative: 6 %
Neutro Abs: 5.4 10*3/uL (ref 1.7–7.7)
Neutrophils Relative %: 74 %
Platelets: 265 10*3/uL (ref 150–400)
RBC: 3.89 MIL/uL (ref 3.87–5.11)
RDW: 12.9 % (ref 11.5–15.5)
WBC: 7.2 10*3/uL (ref 4.0–10.5)
nRBC: 0 % (ref 0.0–0.2)

## 2024-03-21 LAB — URINALYSIS, ROUTINE W REFLEX MICROSCOPIC
Bilirubin Urine: NEGATIVE
Glucose, UA: NEGATIVE mg/dL
Hgb urine dipstick: NEGATIVE
Ketones, ur: NEGATIVE mg/dL
Leukocytes,Ua: NEGATIVE
Nitrite: NEGATIVE
Protein, ur: NEGATIVE mg/dL
Specific Gravity, Urine: 1.013 (ref 1.005–1.030)
pH: 6 (ref 5.0–8.0)

## 2024-03-21 LAB — MAGNESIUM: Magnesium: 1.9 mg/dL (ref 1.7–2.4)

## 2024-03-21 MED ORDER — SODIUM CHLORIDE 0.9 % IV BOLUS
1000.0000 mL | Freq: Once | INTRAVENOUS | Status: AC
  Administered 2024-03-21: 1000 mL via INTRAVENOUS

## 2024-03-21 MED ORDER — HYDRALAZINE HCL 20 MG/ML IJ SOLN: 10.0000 mg | Freq: Four times a day (QID) | INTRAMUSCULAR | Status: DC | PRN

## 2024-03-21 MED ORDER — MORPHINE SULFATE (PF) 4 MG/ML IV SOLN
4.0000 mg | Freq: Once | INTRAVENOUS | Status: AC
  Administered 2024-03-21: 4 mg via INTRAVENOUS
  Filled 2024-03-21: qty 1

## 2024-03-21 MED ORDER — ACETAMINOPHEN 650 MG RE SUPP: 650.0000 mg | Freq: Four times a day (QID) | RECTAL | Status: DC | PRN

## 2024-03-21 MED ORDER — DIATRIZOATE MEGLUMINE & SODIUM 66-10 % PO SOLN
90.0000 mL | Freq: Once | ORAL | Status: AC
  Administered 2024-03-21: 90 mL via ORAL
  Filled 2024-03-21: qty 90

## 2024-03-21 MED ORDER — SODIUM CHLORIDE 0.9% FLUSH
3.0000 mL | Freq: Two times a day (BID) | INTRAVENOUS | Status: DC
  Administered 2024-03-21 – 2024-03-22 (×3): 3 mL via INTRAVENOUS

## 2024-03-21 MED ORDER — ACETAMINOPHEN 325 MG PO TABS: 650.0000 mg | ORAL_TABLET | Freq: Four times a day (QID) | ORAL | Status: DC | PRN

## 2024-03-21 MED ORDER — NALOXONE HCL 0.4 MG/ML IJ SOLN: 0.4000 mg | INTRAMUSCULAR | Status: DC | PRN

## 2024-03-21 MED ORDER — ONDANSETRON HCL 4 MG/2ML IJ SOLN: 4.0000 mg | Freq: Four times a day (QID) | INTRAMUSCULAR | Status: DC | PRN

## 2024-03-21 MED ORDER — HYDROMORPHONE HCL 1 MG/ML IJ SOLN: 0.5000 mg | INTRAMUSCULAR | Status: DC | PRN

## 2024-03-21 MED ORDER — LACTATED RINGERS IV SOLN: INTRAVENOUS | Status: AC

## 2024-03-21 MED ORDER — IOHEXOL 300 MG/ML  SOLN
100.0000 mL | Freq: Once | INTRAMUSCULAR | Status: AC | PRN
  Administered 2024-03-21: 80 mL via INTRAVENOUS

## 2024-03-21 MED ORDER — ENOXAPARIN SODIUM 40 MG/0.4ML IJ SOSY
40.0000 mg | PREFILLED_SYRINGE | INTRAMUSCULAR | Status: DC
  Administered 2024-03-21 – 2024-03-22 (×2): 40 mg via SUBCUTANEOUS
  Filled 2024-03-21 (×2): qty 0.4

## 2024-03-21 MED ORDER — ONDANSETRON HCL 4 MG/2ML IJ SOLN
4.0000 mg | Freq: Once | INTRAMUSCULAR | Status: AC
  Administered 2024-03-21: 4 mg via INTRAVENOUS
  Filled 2024-03-21: qty 2
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
## 2024-03-22 DIAGNOSIS — K56609 Unspecified intestinal obstruction, unspecified as to partial versus complete obstruction: Secondary | ICD-10-CM | POA: Diagnosis not present

## 2024-03-22 LAB — BASIC METABOLIC PANEL WITH GFR
Anion gap: 7 (ref 5–15)
BUN: 13 mg/dL (ref 6–20)
CO2: 25 mmol/L (ref 22–32)
Calcium: 8.7 mg/dL — ABNORMAL LOW (ref 8.9–10.3)
Chloride: 109 mmol/L (ref 98–111)
Creatinine, Ser: 0.98 mg/dL (ref 0.44–1.00)
GFR, Estimated: >60 mL/min (ref >60)
Glucose, Bld: 74 mg/dL (ref 70–99)
Potassium: 3.2 mmol/L — ABNORMAL LOW (ref 3.5–5.1)
Sodium: 141 mmol/L (ref 135–145)

## 2024-03-22 LAB — MAGNESIUM: Magnesium: 2 mg/dL (ref 1.7–2.4)

## 2024-03-22 MED ORDER — POTASSIUM CHLORIDE 20 MEQ PO PACK
40.0000 meq | PACK | Freq: Once | ORAL | Status: AC
  Administered 2024-03-22: 40 meq via ORAL
  Filled 2024-03-22: qty 2

## 2024-03-22 MED ORDER — FLUTICASONE PROPIONATE 50 MCG/ACT NA SUSP: 2.0000 | Freq: Every day | NASAL | Status: AC | PRN
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
## 2024-03-22 NOTE — Plan of Care (Signed)
  Problem: Education: Goal: Knowledge of General Education information will improve Description: Including pain rating scale, medication(s)/side effects and non-pharmacologic comfort measures 03/22/2024 1419 by Verdon Glance, RN Outcome: Completed/Met 03/22/2024 1356 by Verdon Glance, RN Outcome: Progressing   Problem: Health Behavior/Discharge Planning: Goal: Ability to manage health-related needs will improve 03/22/2024 1419 by Verdon Glance, RN Outcome: Completed/Met 03/22/2024 1356 by Verdon Glance, RN Outcome: Progressing   Problem: Clinical Measurements: Goal: Ability to maintain clinical measurements within normal limits will improve 03/22/2024 1419 by Verdon Glance, RN Outcome: Completed/Met 03/22/2024 1356 by Verdon Glance, RN Outcome: Progressing Goal: Will remain free from infection 03/22/2024 1419 by Verdon Glance, RN Outcome: Completed/Met 03/22/2024 1356 by Verdon Glance, RN Outcome: Progressing Goal: Diagnostic test results will improve 03/22/2024 1419 by Verdon Glance, RN Outcome: Completed/Met 03/22/2024 1356 by Verdon Glance, RN Outcome: Progressing Goal: Respiratory complications will improve 03/22/2024 1419 by Verdon Glance, RN Outcome: Completed/Met 03/22/2024 1356 by Verdon Glance, RN Outcome: Progressing Goal: Cardiovascular complication will be avoided 03/22/2024 1419 by Verdon Glance, RN Outcome: Completed/Met 03/22/2024 1356 by Verdon Glance, RN Outcome: Progressing   Problem: Activity: Goal: Risk for activity intolerance will decrease 03/22/2024 1419 by Verdon Glance, RN Outcome: Completed/Met 03/22/2024 1356 by Verdon Glance, RN Outcome: Progressing   Problem: Nutrition: Goal: Adequate nutrition will be maintained 03/22/2024 1419 by Verdon Glance, RN Outcome: Completed/Met 03/22/2024 1356 by Verdon Glance, RN Outcome: Progressing   Problem: Coping: Goal: Level of anxiety will decrease 03/22/2024  1419 by Verdon Glance, RN Outcome: Completed/Met 03/22/2024 1356 by Verdon Glance, RN Outcome: Progressing   Problem: Elimination: Goal: Will not experience complications related to bowel motility 03/22/2024 1419 by Verdon Glance, RN Outcome: Completed/Met 03/22/2024 1356 by Verdon Glance, RN Outcome: Progressing Goal: Will not experience complications related to urinary retention 03/22/2024 1419 by Verdon Glance, RN Outcome: Completed/Met 03/22/2024 1356 by Verdon Glance, RN Outcome: Progressing   Problem: Pain Managment: Goal: General experience of comfort will improve and/or be controlled 03/22/2024 1419 by Verdon Glance, RN Outcome: Completed/Met 03/22/2024 1356 by Verdon Glance, RN Outcome: Progressing   Problem: Safety: Goal: Ability to remain free from injury will improve 03/22/2024 1419 by Verdon Glance, RN Outcome: Completed/Met 03/22/2024 1356 by Verdon Glance, RN Outcome: Progressing   Problem: Skin Integrity: Goal: Risk for impaired skin integrity will decrease 03/22/2024 1419 by Verdon Glance, RN Outcome: Completed/Met 03/22/2024 1356 by Verdon Glance, RN Outcome: Progressing

## 2024-03-22 NOTE — Plan of Care (Signed)

## 2024-03-22 NOTE — Plan of Care (Signed)
  Problem: Activity: Goal: Risk for activity intolerance will decrease Outcome: Progressing   Problem: Pain Managment: Goal: General experience of comfort will improve and/or be controlled Outcome: Progressing
